# Patient Record
Sex: Male | Born: 1950 | Race: Black or African American | Hispanic: No | Marital: Married | Smoking: Never smoker
Health system: Southern US, Community
[De-identification: ages and names within clinical notes are randomized; demographics above are authoritative.]

## PROBLEM LIST (undated history)

## (undated) ENCOUNTER — Ambulatory Visit (HOSPITAL_COMMUNITY): Admission: EM | Source: Home / Self Care

## (undated) DIAGNOSIS — N200 Calculus of kidney: Secondary | ICD-10-CM

## (undated) DIAGNOSIS — I509 Heart failure, unspecified: Secondary | ICD-10-CM

## (undated) DIAGNOSIS — Z7982 Long term (current) use of aspirin: Secondary | ICD-10-CM

## (undated) DIAGNOSIS — I2510 Atherosclerotic heart disease of native coronary artery without angina pectoris: Secondary | ICD-10-CM

## (undated) DIAGNOSIS — E039 Hypothyroidism, unspecified: Secondary | ICD-10-CM

## (undated) DIAGNOSIS — N400 Enlarged prostate without lower urinary tract symptoms: Secondary | ICD-10-CM

## (undated) DIAGNOSIS — I219 Acute myocardial infarction, unspecified: Secondary | ICD-10-CM

## (undated) DIAGNOSIS — E0500 Thyrotoxicosis with diffuse goiter without thyrotoxic crisis or storm: Secondary | ICD-10-CM

## (undated) DIAGNOSIS — M542 Cervicalgia: Secondary | ICD-10-CM

## (undated) DIAGNOSIS — I700 Atherosclerosis of aorta: Secondary | ICD-10-CM

## (undated) DIAGNOSIS — E785 Hyperlipidemia, unspecified: Secondary | ICD-10-CM

## (undated) DIAGNOSIS — I5020 Unspecified systolic (congestive) heart failure: Secondary | ICD-10-CM

## (undated) DIAGNOSIS — I10 Essential (primary) hypertension: Secondary | ICD-10-CM

## (undated) DIAGNOSIS — R911 Solitary pulmonary nodule: Secondary | ICD-10-CM

## (undated) DIAGNOSIS — Z923 Personal history of irradiation: Secondary | ICD-10-CM

## (undated) DIAGNOSIS — N183 Chronic kidney disease, stage 3 unspecified: Secondary | ICD-10-CM

## (undated) DIAGNOSIS — M4802 Spinal stenosis, cervical region: Secondary | ICD-10-CM

## (undated) DIAGNOSIS — N182 Chronic kidney disease, stage 2 (mild): Secondary | ICD-10-CM

## (undated) DIAGNOSIS — M51379 Other intervertebral disc degeneration, lumbosacral region without mention of lumbar back pain or lower extremity pain: Secondary | ICD-10-CM

## (undated) DIAGNOSIS — I447 Left bundle-branch block, unspecified: Secondary | ICD-10-CM

## (undated) DIAGNOSIS — E890 Postprocedural hypothyroidism: Secondary | ICD-10-CM

## (undated) DIAGNOSIS — Z9889 Other specified postprocedural states: Secondary | ICD-10-CM

## (undated) DIAGNOSIS — K7689 Other specified diseases of liver: Secondary | ICD-10-CM

## (undated) DIAGNOSIS — N529 Male erectile dysfunction, unspecified: Secondary | ICD-10-CM

## (undated) DIAGNOSIS — M8580 Other specified disorders of bone density and structure, unspecified site: Secondary | ICD-10-CM

## (undated) HISTORY — DX: Chronic kidney disease, stage 3 unspecified: N18.30

## (undated) HISTORY — DX: Thyrotoxicosis with diffuse goiter without thyrotoxic crisis or storm: E05.00

## (undated) HISTORY — DX: Other specified disorders of bone density and structure, unspecified site: M85.80

## (undated) HISTORY — DX: Cervicalgia: M54.2

## (undated) HISTORY — DX: Chronic kidney disease, stage 2 (mild): N18.2

## (undated) HISTORY — DX: Hyperlipidemia, unspecified: E78.5

## (undated) HISTORY — PX: HAND TENDON SURGERY: SHX663

## (undated) HISTORY — DX: Other specified postprocedural states: Z98.890

## (undated) HISTORY — PX: COLONOSCOPY: SHX174

## (undated) HISTORY — DX: Personal history of irradiation: Z92.3

## (undated) HISTORY — PX: ACHILLES TENDON REPAIR: SUR1153

## (undated) HISTORY — DX: Benign prostatic hyperplasia without lower urinary tract symptoms: N40.0

## (undated) HISTORY — PX: OTHER SURGICAL HISTORY: SHX169

## (undated) HISTORY — PX: TONSILLECTOMY: SUR1361

## (undated) HISTORY — DX: Essential (primary) hypertension: I10

## (undated) HISTORY — DX: Spinal stenosis, cervical region: M48.02

## (undated) HISTORY — DX: Hypothyroidism, unspecified: E03.9

---

## 1991-05-10 HISTORY — PX: ACHILLES TENDON REPAIR: SUR1153

## 2001-12-13 ENCOUNTER — Ambulatory Visit (HOSPITAL_COMMUNITY): Admission: RE | Admit: 2001-12-13 | Discharge: 2001-12-13 | Payer: Self-pay | Admitting: *Deleted

## 2007-05-03 ENCOUNTER — Emergency Department (HOSPITAL_COMMUNITY): Admission: EM | Admit: 2007-05-03 | Discharge: 2007-05-03 | Payer: Self-pay | Admitting: General Surgery

## 2007-05-09 ENCOUNTER — Encounter: Admission: RE | Admit: 2007-05-09 | Discharge: 2007-05-09 | Payer: Self-pay | Admitting: Family Medicine

## 2009-07-24 ENCOUNTER — Emergency Department (HOSPITAL_COMMUNITY): Admission: EM | Admit: 2009-07-24 | Discharge: 2009-07-25 | Payer: Self-pay | Admitting: Emergency Medicine

## 2010-08-01 LAB — POCT I-STAT, CHEM 8
BUN: 14 mg/dL (ref 6–23)
Calcium, Ion: 0.89 mmol/L — ABNORMAL LOW (ref 1.12–1.32)
Chloride: 105 mEq/L (ref 96–112)
Creatinine, Ser: 1.2 mg/dL (ref 0.4–1.5)
Glucose, Bld: 133 mg/dL — ABNORMAL HIGH (ref 70–99)
Hemoglobin: 13.9 g/dL (ref 13.0–17.0)
TCO2: 29 mmol/L (ref 0–100)

## 2011-02-11 LAB — I-STAT 8, (EC8 V) (CONVERTED LAB)
Bicarbonate: 29.2 — ABNORMAL HIGH
Glucose, Bld: 103 — ABNORMAL HIGH
HCT: 47
Operator id: 272551
Sodium: 136
TCO2: 31
pCO2, Ven: 42.3 — ABNORMAL LOW

## 2011-02-11 LAB — POCT I-STAT CREATININE: Creatinine, Ser: 1.4

## 2012-04-08 ENCOUNTER — Emergency Department (HOSPITAL_COMMUNITY): Admission: EM | Admit: 2012-04-08 | Discharge: 2012-04-08 | Discharge status: Against Medical Advice | Payer: Self-pay

## 2012-04-17 ENCOUNTER — Other Ambulatory Visit: Payer: Self-pay | Admitting: Gastroenterology

## 2012-04-17 DIAGNOSIS — R109 Unspecified abdominal pain: Secondary | ICD-10-CM

## 2012-04-18 ENCOUNTER — Ambulatory Visit
Admission: RE | Admit: 2012-04-18 | Discharge: 2012-04-18 | Disposition: A | Discharge status: Home / Self Care | Payer: 59 | Source: Ambulatory Visit | Attending: Gastroenterology | Admitting: Gastroenterology

## 2012-04-18 DIAGNOSIS — R109 Unspecified abdominal pain: Secondary | ICD-10-CM

## 2012-04-18 MED ORDER — IOHEXOL 300 MG/ML  SOLN
100.0000 mL | Freq: Once | INTRAMUSCULAR | Status: AC | PRN
  Administered 2012-04-18: 100 mL via INTRAVENOUS

## 2012-04-19 ENCOUNTER — Other Ambulatory Visit: Payer: Self-pay | Admitting: Gastroenterology

## 2012-04-19 DIAGNOSIS — R1030 Lower abdominal pain, unspecified: Secondary | ICD-10-CM

## 2012-04-19 DIAGNOSIS — R102 Pelvic and perineal pain: Secondary | ICD-10-CM

## 2012-04-22 ENCOUNTER — Ambulatory Visit
Admission: RE | Admit: 2012-04-22 | Discharge: 2012-04-22 | Disposition: A | Discharge status: Home / Self Care | Payer: 59 | Source: Ambulatory Visit | Attending: Gastroenterology | Admitting: Gastroenterology

## 2012-04-22 DIAGNOSIS — R102 Pelvic and perineal pain: Secondary | ICD-10-CM

## 2012-04-22 DIAGNOSIS — R1030 Lower abdominal pain, unspecified: Secondary | ICD-10-CM

## 2012-04-22 MED ORDER — GADOBENATE DIMEGLUMINE 529 MG/ML IV SOLN
18.0000 mL | Freq: Once | INTRAVENOUS | Status: AC | PRN
  Administered 2012-04-22: 18 mL via INTRAVENOUS

## 2012-04-24 ENCOUNTER — Other Ambulatory Visit: Payer: 59

## 2013-05-08 ENCOUNTER — Ambulatory Visit
Admission: RE | Admit: 2013-05-08 | Discharge: 2013-05-08 | Disposition: A | Discharge status: Home / Self Care | Payer: 59 | Source: Ambulatory Visit | Attending: Family Medicine | Admitting: Family Medicine

## 2013-05-08 ENCOUNTER — Other Ambulatory Visit: Payer: Self-pay | Admitting: Family Medicine

## 2013-05-08 DIAGNOSIS — R202 Paresthesia of skin: Secondary | ICD-10-CM

## 2013-05-08 DIAGNOSIS — M542 Cervicalgia: Secondary | ICD-10-CM

## 2013-09-12 ENCOUNTER — Other Ambulatory Visit: Payer: Self-pay | Admitting: Orthopedic Surgery

## 2013-09-12 ENCOUNTER — Ambulatory Visit
Admission: RE | Admit: 2013-09-12 | Discharge: 2013-09-12 | Disposition: A | Discharge status: Home / Self Care | Payer: 59 | Source: Ambulatory Visit | Attending: Orthopedic Surgery | Admitting: Orthopedic Surgery

## 2013-09-12 DIAGNOSIS — M542 Cervicalgia: Secondary | ICD-10-CM

## 2015-10-14 ENCOUNTER — Other Ambulatory Visit: Payer: Self-pay | Admitting: Family Medicine

## 2015-10-14 ENCOUNTER — Ambulatory Visit
Admission: RE | Admit: 2015-10-14 | Discharge: 2015-10-14 | Disposition: A | Discharge status: Home / Self Care | Payer: Commercial Managed Care - HMO | Source: Ambulatory Visit | Attending: Family Medicine | Admitting: Family Medicine

## 2015-10-14 DIAGNOSIS — M79605 Pain in left leg: Principal | ICD-10-CM

## 2015-10-14 DIAGNOSIS — M79604 Pain in right leg: Secondary | ICD-10-CM

## 2015-10-22 ENCOUNTER — Other Ambulatory Visit: Payer: Self-pay | Admitting: Endocrinology

## 2015-10-22 DIAGNOSIS — E0590 Thyrotoxicosis, unspecified without thyrotoxic crisis or storm: Secondary | ICD-10-CM

## 2015-12-01 ENCOUNTER — Ambulatory Visit (HOSPITAL_COMMUNITY)
Admission: RE | Admit: 2015-12-01 | Discharge: 2015-12-01 | Disposition: A | Discharge status: Home / Self Care | Payer: Commercial Managed Care - HMO | Source: Ambulatory Visit | Attending: Endocrinology | Admitting: Endocrinology

## 2015-12-01 DIAGNOSIS — E0590 Thyrotoxicosis, unspecified without thyrotoxic crisis or storm: Secondary | ICD-10-CM | POA: Insufficient documentation

## 2015-12-01 MED ORDER — SODIUM IODIDE I 131 CAPSULE
9.0000 | Freq: Once | INTRAVENOUS | Status: AC | PRN
  Administered 2015-12-01: 9 via ORAL

## 2015-12-02 ENCOUNTER — Ambulatory Visit (HOSPITAL_COMMUNITY)
Admission: RE | Admit: 2015-12-02 | Discharge: 2015-12-02 | Disposition: A | Discharge status: Home / Self Care | Payer: Commercial Managed Care - HMO | Source: Ambulatory Visit | Attending: Endocrinology | Admitting: Endocrinology

## 2015-12-02 DIAGNOSIS — E0590 Thyrotoxicosis, unspecified without thyrotoxic crisis or storm: Secondary | ICD-10-CM | POA: Diagnosis present

## 2015-12-02 MED ORDER — SODIUM PERTECHNETATE TC 99M INJECTION
9.9000 | Freq: Once | INTRAVENOUS | Status: AC | PRN
  Administered 2015-12-02: 10 via INTRAVENOUS

## 2015-12-06 ENCOUNTER — Other Ambulatory Visit: Payer: Self-pay | Admitting: Endocrinology

## 2015-12-06 DIAGNOSIS — E0590 Thyrotoxicosis, unspecified without thyrotoxic crisis or storm: Secondary | ICD-10-CM

## 2015-12-15 ENCOUNTER — Ambulatory Visit (HOSPITAL_COMMUNITY)
Admission: RE | Admit: 2015-12-15 | Discharge: 2015-12-15 | Disposition: A | Discharge status: Home / Self Care | Payer: Medicare Other | Source: Ambulatory Visit | Attending: Endocrinology | Admitting: Endocrinology

## 2015-12-15 DIAGNOSIS — E0590 Thyrotoxicosis, unspecified without thyrotoxic crisis or storm: Secondary | ICD-10-CM | POA: Diagnosis present

## 2015-12-15 MED ORDER — SODIUM IODIDE I 131 CAPSULE
14.0000 | Freq: Once | INTRAVENOUS | Status: AC | PRN
  Administered 2015-12-15: 14.3 via ORAL

## 2016-12-20 IMAGING — CR DG LUMBAR SPINE COMPLETE 4+V
5 series · 5 of 5 positions shown · non-contrast
Comparison: CT scan 04/18/2012

CLINICAL DATA: Bilateral leg pain, low back pain for 1 month

EXAM:
LUMBAR SPINE - COMPLETE 4+ VIEW

[t l-spine a.p.]
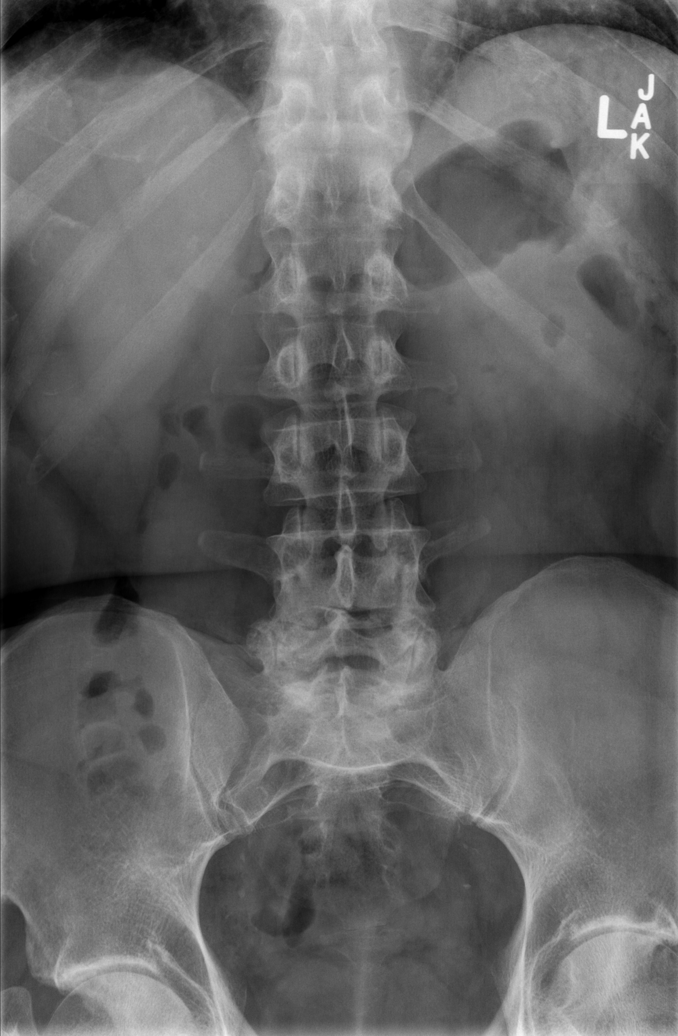

[t l-spine oblique exposure (1 of 2)]
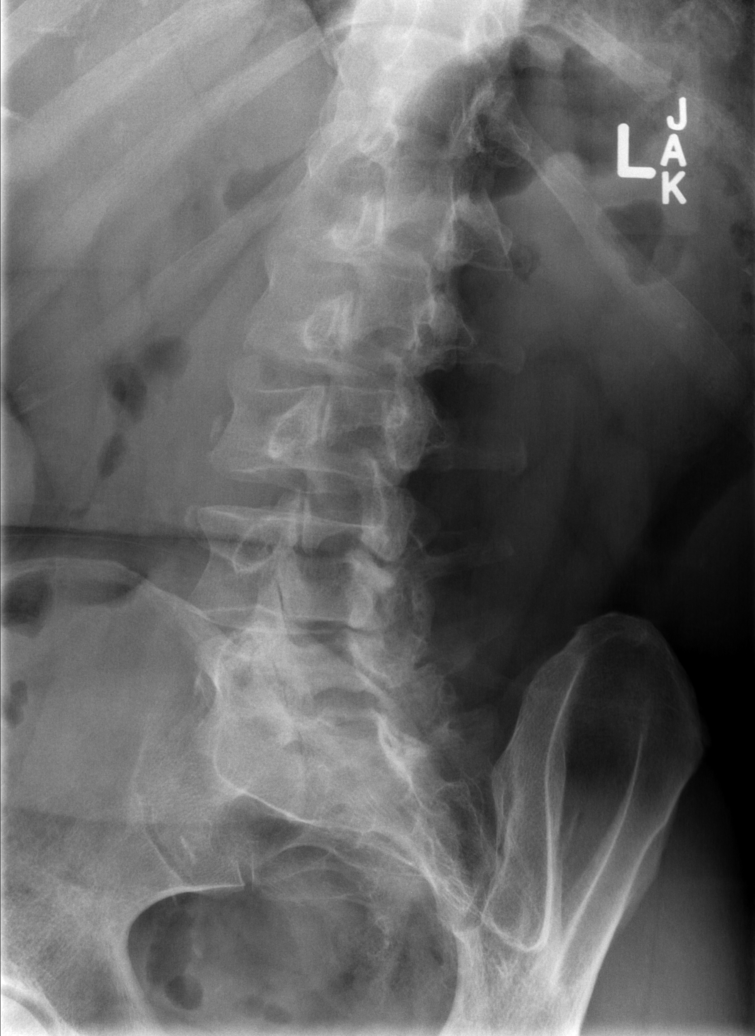

[t l-spine oblique exposure (2 of 2)]
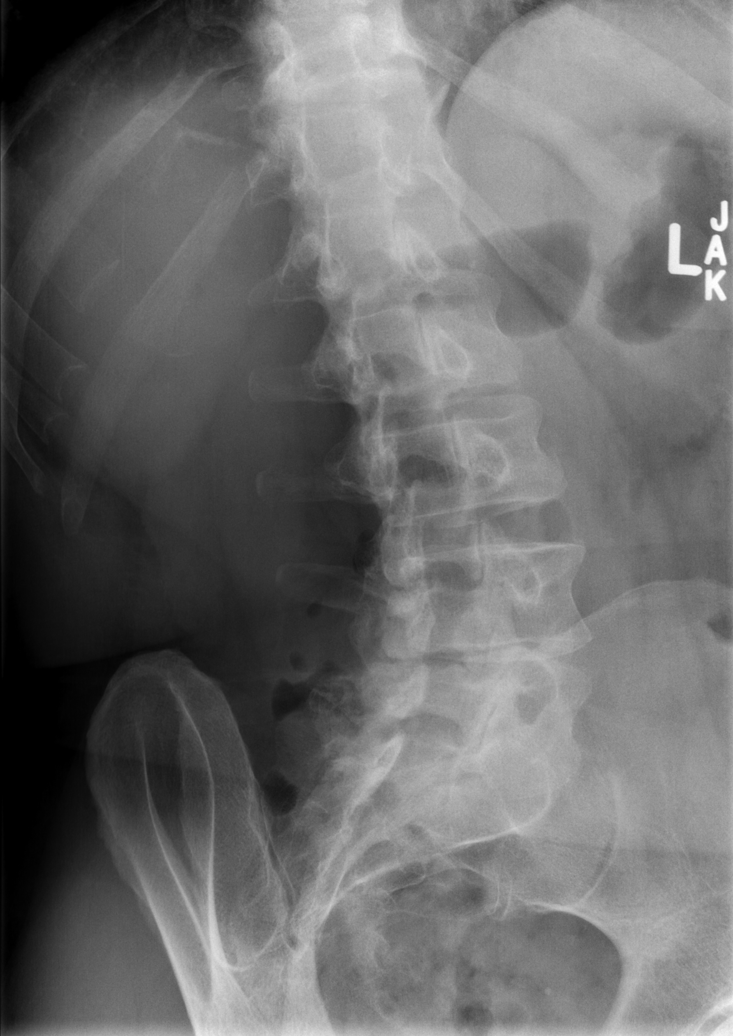

[t l-spine lat]
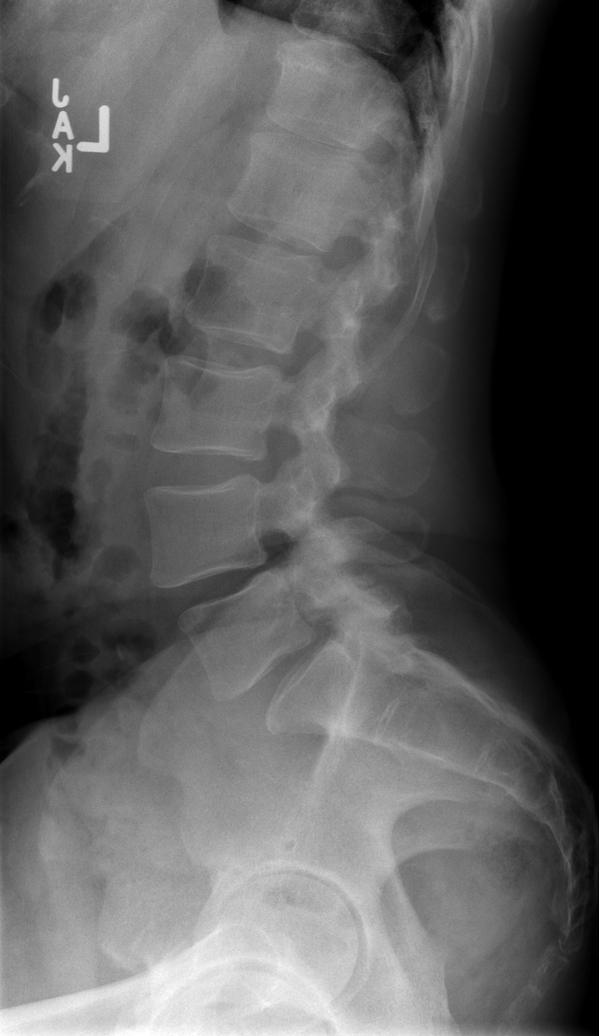

[t l-spine l5-s1 spot]
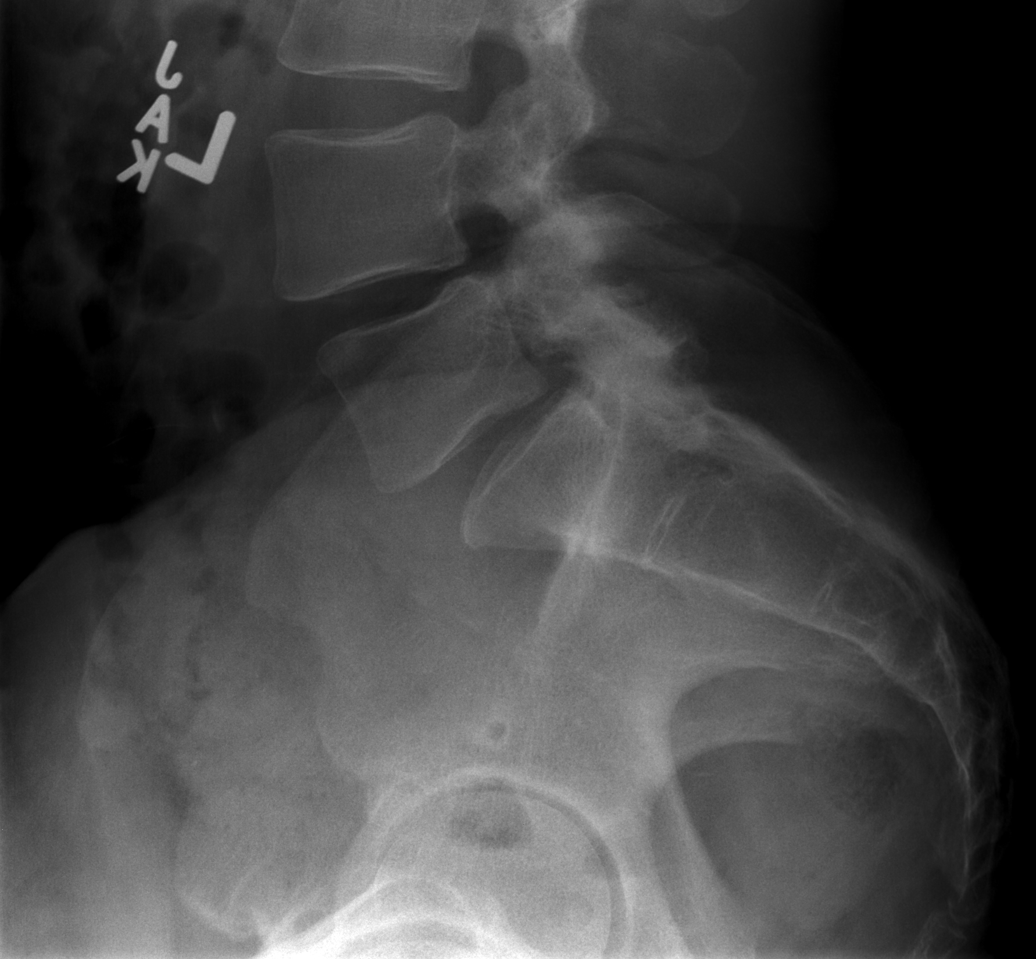

[5 of 5 positions shown; findings below may reference images not displayed]

FINDINGS: Five views of the lumbar spine submitted. No acute fracture. There
is mild anterolisthesis about 3 mm L5 on S1 vertebral body. Facet
degenerative changes noted at L5 level. Mild disc space flattening
at L4-L5 and L5-S1 level. Mild disc space flattening with anterior
spurring noted at T11- T12 level.
IMPRESSION: No acute fracture. Mild disc space flattening at L4-L5 and L5-S1
level. Facet degenerative changes L5 level. There is mild
anterolisthesis about 3 mm L5 on S1 vertebral body.

## 2017-02-20 IMAGING — NM NM RAI THERAPY FOR HYPERTHYROIDISM
1 series · 1 of 1 positions shown · non-contrast
Comparison: Nuclear medicine thyroid scan and uptake 12/02/2015

CLINICAL DATA: Hyperthyroidism

EXAM:
RADIOACTIVE IODINE THERAPY FOR HYPERTHYROIDISM
TECHNIQUE: Radioactive iodine prescribed by Dr. Ceola. The risks and
benefits of radioactive iodine therapy were discussed with the
patient in detail by Dr. Fublack. Radiation safety was discussed
with the patient, including how to protect the general public from
exposure. There were no barriers to communication. Written consent
was obtained. The patient then received a capsule containing the
radiopharmaceutical.
The patient will follow-up with the referring physician.
RADIOPHARMACEUTICALS:  14.3 mCi G-8F8 sodium iodide orally

[Series 1: static · 2.07mm/px · 1 of 1 slices shown]
[im 1/1]
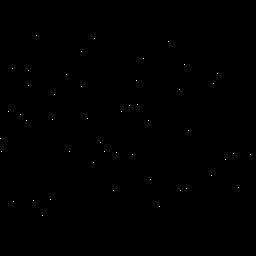

[1 of 1 positions shown; findings below may reference images not displayed]

IMPRESSION: Per oral administration of G-8F8 sodium iodide for the treatment of
hyperthyroidism.

## 2018-10-29 ENCOUNTER — Other Ambulatory Visit: Payer: Self-pay | Admitting: Family Medicine

## 2018-10-29 DIAGNOSIS — N182 Chronic kidney disease, stage 2 (mild): Secondary | ICD-10-CM

## 2018-10-31 ENCOUNTER — Ambulatory Visit
Admission: RE | Admit: 2018-10-31 | Discharge: 2018-10-31 | Disposition: A | Discharge status: Home / Self Care | Payer: Medicare Other | Source: Ambulatory Visit | Attending: Family Medicine | Admitting: Family Medicine

## 2018-10-31 DIAGNOSIS — N182 Chronic kidney disease, stage 2 (mild): Secondary | ICD-10-CM

## 2020-02-11 ENCOUNTER — Ambulatory Visit: Payer: Medicare Other | Attending: Internal Medicine

## 2020-02-11 DIAGNOSIS — Z23 Encounter for immunization: Secondary | ICD-10-CM

## 2020-02-11 NOTE — Progress Notes (Signed)
   Covid-19 Vaccination Clinic  Name:  Omar [place]    MRN: [id number] DOB: 21-Apr-1951  02/11/2020  [name] was observed post Covid-19 immunization for 15 minutes without incident. He was provided with Vaccine Information Sheet and instruction to access the V-Safe system.   [name] was instructed to call 911 with any severe reactions post vaccine: Marland Kitchen Difficulty breathing  . Swelling of face and throat  . A fast heartbeat  . A bad rash all over body  . Dizziness and weakness

## 2020-10-29 ENCOUNTER — Ambulatory Visit: Payer: Medicare Other

## 2020-11-18 ENCOUNTER — Ambulatory Visit: Payer: Medicare Other | Attending: Internal Medicine

## 2020-11-18 ENCOUNTER — Other Ambulatory Visit: Payer: Self-pay

## 2020-11-18 DIAGNOSIS — Z23 Encounter for immunization: Secondary | ICD-10-CM

## 2020-11-19 ENCOUNTER — Other Ambulatory Visit (HOSPITAL_BASED_OUTPATIENT_CLINIC_OR_DEPARTMENT_OTHER): Payer: Self-pay

## 2020-11-19 MED ORDER — PFIZER-BIONT COVID-19 VAC-TRIS 30 MCG/0.3ML IM SUSP
INTRAMUSCULAR | 0 refills | Status: DC
  Filled 2020-11-19: qty 0.3, 1d supply, fill #0

## 2020-11-19 NOTE — Progress Notes (Signed)
   Covid-19 Vaccination Clinic  Name:  Omar Hill    MRN: [id number] DOB: 15-Aug-1950  11/19/2020  [name] was observed post Covid-19 immunization for 15 minutes without incident. He was provided with Vaccine Information Sheet and instruction to access the V-Safe system.   [name] was instructed to call 911 with any severe reactions post vaccine: Difficulty breathing  Swelling of face and throat  A fast heartbeat  A bad rash all over body  Dizziness and weakness   Immunizations Administered     Name Date Dose VIS Date Route   PFIZER Comrnaty(Gray TOP) Covid-19 Vaccine 11/18/2020  2:24 PM 0.3 mL 04/16/2020 Intramuscular   Manufacturer: [employer], [employer]   Lot: [id number]   NDC: [phone]

## 2021-09-21 ENCOUNTER — Other Ambulatory Visit: Payer: Self-pay | Admitting: Internal Medicine

## 2021-09-21 DIAGNOSIS — Z13820 Encounter for screening for osteoporosis: Secondary | ICD-10-CM

## 2021-11-04 ENCOUNTER — Ambulatory Visit
Admission: RE | Admit: 2021-11-04 | Discharge: 2021-11-04 | Disposition: A | Discharge status: Home / Self Care | Payer: Medicare Other | Source: Ambulatory Visit | Attending: Internal Medicine | Admitting: Internal Medicine

## 2021-11-04 DIAGNOSIS — Z13820 Encounter for screening for osteoporosis: Secondary | ICD-10-CM

## 2022-03-07 ENCOUNTER — Encounter (HOSPITAL_BASED_OUTPATIENT_CLINIC_OR_DEPARTMENT_OTHER): Payer: Self-pay

## 2022-03-07 ENCOUNTER — Other Ambulatory Visit: Payer: Self-pay

## 2022-03-07 DIAGNOSIS — R42 Dizziness and giddiness: Secondary | ICD-10-CM | POA: Diagnosis not present

## 2022-03-07 DIAGNOSIS — R112 Nausea with vomiting, unspecified: Secondary | ICD-10-CM | POA: Diagnosis not present

## 2022-03-07 LAB — COMPREHENSIVE METABOLIC PANEL
ALT: 18 U/L (ref 0–44)
AST: 28 U/L (ref 15–41)
Albumin: 4.5 g/dL (ref 3.5–5.0)
Alkaline Phosphatase: 49 U/L (ref 38–126)
Anion gap: 7 (ref 5–15)
BUN: 15 mg/dL (ref 8–23)
CO2: 29 mmol/L (ref 22–32)
Calcium: 9.4 mg/dL (ref 8.9–10.3)
Chloride: 100 mmol/L (ref 98–111)
Creatinine, Ser: 1.25 mg/dL — ABNORMAL HIGH (ref 0.61–1.24)
GFR, Estimated: >60 mL/min (ref >60)
Glucose, Bld: 127 mg/dL — ABNORMAL HIGH (ref 70–99)
Potassium: 3.4 mmol/L — ABNORMAL LOW (ref 3.5–5.1)
Sodium: 136 mmol/L (ref 135–145)
Total Bilirubin: 0.5 mg/dL (ref 0.3–1.2)
Total Protein: 8.3 g/dL — ABNORMAL HIGH (ref 6.5–8.1)

## 2022-03-07 LAB — URINALYSIS, ROUTINE W REFLEX MICROSCOPIC
Bilirubin Urine: NEGATIVE
Glucose, UA: 100 mg/dL — AB
Hgb urine dipstick: NEGATIVE
Ketones, ur: NEGATIVE mg/dL
Leukocytes,Ua: NEGATIVE
Nitrite: NEGATIVE
Protein, ur: NEGATIVE mg/dL
Specific Gravity, Urine: 1.014 (ref 1.005–1.030)
pH: 7.5 (ref 5.0–8.0)

## 2022-03-07 LAB — CBC
HCT: 44.4 % (ref 39.0–52.0)
Hemoglobin: 14.8 g/dL (ref 13.0–17.0)
MCH: 27.7 pg (ref 26.0–34.0)
MCHC: 33.3 g/dL (ref 30.0–36.0)
MCV: 83 fL (ref 80.0–100.0)
Platelets: 204 10*3/uL (ref 150–400)
RBC: 5.35 MIL/uL (ref 4.22–5.81)
RDW: 13.8 % (ref 11.5–15.5)
WBC: 8.6 10*3/uL (ref 4.0–10.5)
nRBC: 0 % (ref 0.0–0.2)

## 2022-03-07 LAB — LIPASE, BLOOD: Lipase: 47 U/L (ref 11–51)

## 2022-03-07 NOTE — ED Triage Notes (Signed)
Pt reports nausea, dizziness, and 1 episode of vomiting after mowing his lawn today. Pt reports relief from the dizziness after the vomiting.

## 2022-03-08 ENCOUNTER — Emergency Department (HOSPITAL_BASED_OUTPATIENT_CLINIC_OR_DEPARTMENT_OTHER)
Admission: EM | Admit: 2022-03-08 | Discharge: 2022-03-08 | Disposition: A | Discharge status: Home / Self Care | Payer: Medicare Other | Attending: Emergency Medicine | Admitting: Emergency Medicine

## 2022-03-08 DIAGNOSIS — R42 Dizziness and giddiness: Secondary | ICD-10-CM

## 2022-03-08 LAB — CBG MONITORING, ED: Glucose-Capillary: 110 mg/dL — ABNORMAL HIGH (ref 70–99)

## 2022-03-08 MED ORDER — MECLIZINE HCL 25 MG PO TABS: 25.0000 mg | ORAL_TABLET | Freq: Three times a day (TID) | ORAL | 0 refills | Status: DC | PRN

## 2022-03-08 NOTE — ED Notes (Signed)
Patient states he vomited x 1 and his nausea, abd pain, and dizziness has " gone away"

## 2022-03-08 NOTE — ED Provider Notes (Signed)
[facility] EMERGENCY DEPT  Provider Note  CSN: [id number] Arrival date & time: 03/07/22 2047  History Chief Complaint  Patient presents with   Dizziness   Nausea    Omar Hill is a 71 y.o. male reports he was in his usual state of health mowing the grass this afternoon. He came in to the house and began to feel dizzy. Describes as room spinning and associated with nausea. Symptoms were worse with standing and improving with lying down. Felt off balance while walking. Symptoms lasted for several hours before he had one episode of emesis and then the dizziness resolved. He has been asymptomatic since then including extended wait time in the ED. No prior history of same.    Home Medications Prior to Admission medications   Medication Sig Start Date End Date Taking? Authorizing Provider  meclizine (ANTIVERT) 25 MG tablet Take 1 tablet (25 mg total) by mouth 3 (three) times daily as needed for dizziness. 03/08/22  Yes Pollyann Savoy, MD  COVID-19 mRNA Vac-TriS, Pfizer, (PFIZER-BIONT COVID-19 VAC-TRIS) SUSP injection Inject into the muscle. 11/19/20   Judyann Munson, MD     Allergies    Patient has no known allergies.   Review of Systems   Review of Systems Please see HPI for pertinent positives and negatives  Physical Exam BP (!) 152/87   Pulse (!) 57   Temp (!) 97.3 F (36.3 C)   Resp 16   Ht 6' (1.829 m)   Wt 89.8 kg   SpO2 97%   BMI 26.85 kg/m   Physical Exam Vitals and nursing note reviewed.  Constitutional:      Appearance: Normal appearance.  HENT:     Head: Normocephalic and atraumatic.     Nose: Nose normal.     Mouth/Throat:     Mouth: Mucous membranes are moist.  Eyes:     Extraocular Movements: Extraocular movements intact.     Conjunctiva/sclera: Conjunctivae normal.  Cardiovascular:     Rate and Rhythm: Normal rate.  Pulmonary:     Effort: Pulmonary effort is normal.     Breath sounds: Normal breath sounds.  Abdominal:      General: Abdomen is flat.     Palpations: Abdomen is soft.     Tenderness: There is no abdominal tenderness.  Musculoskeletal:        General: No swelling. Normal range of motion.     Cervical back: Neck supple.  Skin:    General: Skin is warm and dry.  Neurological:     General: No focal deficit present.     Mental Status: He is alert and oriented to person, place, and time.     Cranial Nerves: No cranial nerve deficit.     Sensory: No sensory deficit.     Motor: No weakness.     Coordination: Coordination normal.     Gait: Gait normal.     Deep Tendon Reflexes: Reflexes normal.  Psychiatric:        Mood and Affect: Mood normal.     ED Results / Procedures / Treatments   EKG EKG Interpretation  Date/Time:  Monday March 07 2022 20:56:00 EDT Ventricular Rate:  57 PR Interval:  224 QRS Duration: 154 QT Interval:  474 QTC Calculation: 461 R Axis:   58 Text Interpretation: Sinus bradycardia with 1st degree A-V block Left bundle branch block Negative sgarbossa's Abnormal ECG When compared with ECG of 03-May-2007 00:30, PR interval has increased Left bundle branch block is  now Present Confirmed by Regan Lemming ([id number]) on 03/07/2022 11:33:40 PM  Procedures Procedures  Medications Ordered in the ED Medications - No data to display  Initial Impression and Plan  Patient here with vertiginous dizziness, lasted for several hours and has now resolved. Exam is benign, low concern for central cause. Suspect BPPV, labs done in triage showed unremarkable CBC, CMP and UA. Will give Rx for Meclizine, PCP follow up, RTED for any worsening.   ED Course       MDM Rules/Calculators/A&P Medical Decision Making Given presenting complaint, I considered that admission might be necessary. After review of results from ED lab and/or imaging studies, admission to the hospital is not indicated at this time.    Problems Addressed: Vertigo: acute illness or injury  Amount and/or Complexity  of Data Reviewed Labs: ordered. Decision-making details documented in ED Course. ECG/medicine tests: ordered and independent interpretation performed. Decision-making details documented in ED Course.  Risk Prescription drug management. Decision regarding hospitalization.    Final Clinical Impression(s) / ED Diagnoses Final diagnoses:  Vertigo    Rx / DC Orders ED Discharge Orders          Ordered    meclizine (ANTIVERT) 25 MG tablet  3 times daily PRN        03/08/22 0141             Truddie Hidden, MD 03/08/22 [phone]

## 2022-05-19 ENCOUNTER — Ambulatory Visit: Payer: Medicare Other | Admitting: Podiatry

## 2022-05-19 VITALS — BP 130/70

## 2022-05-19 DIAGNOSIS — M76821 Posterior tibial tendinitis, right leg: Secondary | ICD-10-CM

## 2022-05-19 NOTE — Progress Notes (Signed)
  Subjective:  Patient ID: Omar Hill, male    DOB: 29-Oct-1950,  MRN: [id number]  Chief Complaint  Patient presents with   Foot Pain    Pt stated that he wanted to have his right foot looked at because he has been having some discomfort     72 y.o. male presents with the above complaint.  Patient presents with right medial foot pain has been going on for quite some time is progressive gotten worse worse with ambulation hurts with pressure.  He would like to discuss treatment options for this.  He states that it has been hurting but is getting much better he did some work up and down the ladder to put the Christmas tree out.  Pain scale is 5 out of 10 now.   Review of Systems: Negative except as noted in the HPI. Denies N/V/F/Ch.  No past medical history on file.  Current Outpatient Medications:    COVID-19 mRNA Vac-TriS, Pfizer, (PFIZER-BIONT COVID-19 VAC-TRIS) SUSP injection, Inject into the muscle., Disp: 0.3 mL, Rfl: 0   meclizine (ANTIVERT) 25 MG tablet, Take 1 tablet (25 mg total) by mouth 3 (three) times daily as needed for dizziness., Disp: 30 tablet, Rfl: 0  Social History   Tobacco Use  Smoking Status Never  Smokeless Tobacco Never    No Known Allergies Objective:   Vitals:   05/19/22 0850  BP: 130/70   There is no height or weight on file to calculate BMI. Constitutional Well developed. Well nourished.  Vascular Dorsalis pedis pulses palpable bilaterally. Posterior tibial pulses palpable bilaterally. Capillary refill normal to all digits.  No cyanosis or clubbing noted. Pedal hair growth normal.  Neurologic Normal speech. Oriented to person, place, and time. Epicritic sensation to light touch grossly present bilaterally.  Dermatologic Nails well groomed and normal in appearance. No open wounds. No skin lesions.  Orthopedic: Pain on palpation to the medial foot.  Pain along the course of the posterior tibial tendon including the insertion pain with  resisted plantarflexion inversion of the foot.  No pain with dorsiflexion eversion of the foot.   Radiographs: None Assessment:   1. Posterior tibial tendinitis, right    Plan:  Patient was evaluated and treated and all questions answered.  Right posterior tibial tendinitis -All questions and concerns were discussed with the patient in extensive detail -Given that his pain is improving and is mild to moderate in nature will benefit from Tri-Lock ankle brace to give stability while ambulating.  If it regresses I discussed that he may benefit from cam boot immobilization. -Tri-Lock ankle brace was dispensed -Continue wearing custom orthotics for pes planovalgus  No follow-ups on file.

## 2022-09-30 ENCOUNTER — Other Ambulatory Visit (HOSPITAL_BASED_OUTPATIENT_CLINIC_OR_DEPARTMENT_OTHER): Payer: Self-pay

## 2022-09-30 MED ORDER — COMIRNATY 30 MCG/0.3ML IM SUSY
0.3000 mL | PREFILLED_SYRINGE | INTRAMUSCULAR | 0 refills | Status: DC
  Filled 2022-09-30: qty 0.3, 1d supply, fill #0

## 2023-04-20 ENCOUNTER — Other Ambulatory Visit (HOSPITAL_BASED_OUTPATIENT_CLINIC_OR_DEPARTMENT_OTHER): Payer: Self-pay | Admitting: Family Medicine

## 2023-04-20 DIAGNOSIS — E782 Mixed hyperlipidemia: Secondary | ICD-10-CM

## 2023-05-10 DIAGNOSIS — C801 Malignant (primary) neoplasm, unspecified: Secondary | ICD-10-CM

## 2023-05-10 HISTORY — DX: Malignant (primary) neoplasm, unspecified: C80.1

## 2023-05-11 ENCOUNTER — Ambulatory Visit (HOSPITAL_BASED_OUTPATIENT_CLINIC_OR_DEPARTMENT_OTHER)
Admission: RE | Admit: 2023-05-11 | Discharge: 2023-05-11 | Disposition: A | Discharge status: Home / Self Care | Payer: Medicare Other | Source: Ambulatory Visit | Attending: Family Medicine | Admitting: Family Medicine

## 2023-05-11 DIAGNOSIS — R911 Solitary pulmonary nodule: Secondary | ICD-10-CM

## 2023-05-11 DIAGNOSIS — E782 Mixed hyperlipidemia: Secondary | ICD-10-CM | POA: Insufficient documentation

## 2023-05-11 HISTORY — DX: Solitary pulmonary nodule: R91.1

## 2023-05-30 ENCOUNTER — Other Ambulatory Visit (HOSPITAL_BASED_OUTPATIENT_CLINIC_OR_DEPARTMENT_OTHER): Payer: Self-pay

## 2023-05-30 MED ORDER — COVID-19 MRNA VAC-TRIS(PFIZER) 30 MCG/0.3ML IM SUSY
0.3000 mL | PREFILLED_SYRINGE | Freq: Once | INTRAMUSCULAR | 0 refills | Status: AC
  Filled 2023-05-30: qty 0.3, 1d supply, fill #0

## 2023-06-15 ENCOUNTER — Encounter: Payer: Self-pay | Admitting: Cardiovascular Disease

## 2023-06-15 ENCOUNTER — Telehealth: Payer: Self-pay

## 2023-06-15 NOTE — Progress Notes (Signed)
 Cardiology Office Note:  .   Date:  06/16/2023  ID:  Omar Hill, DOB [birth year], MRN [id number] PCP: Loreli Kins, MD  [facility] Providers Cardiologist:  None    History of Present Illness: .   Omar [place] is a 73 y.o. male with hx of HTN , HLD,  He had a coronary calcium  score  of 2170 ( 93rd percentile for age / sex matched controls )   No CP , no dyspenea  His Coronary CT scan was ordered by his primary  He was started on Rosuvastatin  when they received the results  He has HTN,   has been well controlled.  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  We discussed the PCSK9 inhibitors and also inclisiran.  He is not not inclined so much to give himself an injection.  We discussed the ease of getting inclisiran injections every 6 months.  Will explore that further.  I will see him in mid June for a follow-up appointment.  He has lipids that will be rechecked by his primary medical doctor later this month.    ROS:   Studies Reviewed: SABRA   EKG Interpretation Date/Time:  Friday June 16 2023 11:35:14 EST Ventricular Rate:  68 PR Interval:  204 QRS Duration:  164 QT Interval:  438 QTC Calculation: 465 R Axis:   88  Text Interpretation: Normal sinus rhythm with sinus arrhythmia Left bundle branch block When compared with ECG of 07-Mar-2022 20:56, No significant change was found Confirmed by Alveta Mungo ([id number]) on 06/16/2023 6:26:09 PM     Risk Assessment/Calculations:             Physical Exam:   VS:  BP 132/80 (BP Location: Right Arm, Patient Position: Sitting)    Pulse 68   Ht 6' (1.829 m)   Wt 194 lb 9.6 oz (88.3 kg)   SpO2 96%   BMI 26.39 kg/m    Wt Readings from Last 3 Encounters:  06/16/23 194 lb 9.6 oz (88.3 kg)  03/07/22 198 lb (89.8 kg)    GEN: Well nourished, well developed in no acute distress NECK: No JVD; No carotid bruits CARDIAC: RRR, no murmurs, rubs, gallops RESPIRATORY:  Clear to auscultation without rales, wheezing or rhonchi  ABDOMEN: Soft, non-tender, non-distended EXTREMITIES:  No edema; No deformity   ASSESSMENT AND PLAN: .     CAC  His las LDL was 154.  He was started on rosuvastatin  at that time.  His goal for LDL is < 70.   He thinks that he might be having some muscle aches that may be related to his rosuvastatin .  He will keep an eye on this.  I would have a very low threshold to refer him for PCSK9 inhibitor or inclisiran if he thinks he is having an intolerance to the rosuvastatin .  He is not having any episodes of angina.  2.   Left bundle branch block :  Has had a LBBB for years ( he recalls having a myoview  test 15 +  years ago  Will get an echocardiogram and a Lexiscan  Myoview  study for further evaluation.    Dispo: 3-4 months   Signed, Aleene Passe, MD

## 2023-06-15 NOTE — Telephone Encounter (Signed)
 Med Rec complete, allergies and Pharmacy verified June 15 2023.

## 2023-06-16 ENCOUNTER — Ambulatory Visit: Payer: PPO | Attending: Cardiovascular Disease | Admitting: Cardiovascular Disease

## 2023-06-16 ENCOUNTER — Encounter: Payer: Self-pay | Admitting: Cardiovascular Disease

## 2023-06-16 VITALS — BP 132/80 | HR 68 | Ht 72.0 in | Wt 194.6 lb

## 2023-06-16 DIAGNOSIS — I10 Essential (primary) hypertension: Secondary | ICD-10-CM

## 2023-06-16 DIAGNOSIS — I2510 Atherosclerotic heart disease of native coronary artery without angina pectoris: Secondary | ICD-10-CM

## 2023-06-16 DIAGNOSIS — E785 Hyperlipidemia, unspecified: Secondary | ICD-10-CM

## 2023-06-16 NOTE — Patient Instructions (Signed)
 Follow-Up: At [facility], you and your health needs are our priority.  As part of our continuing mission to provide you with exceptional heart care, we have created designated Provider Care Teams.  These Care Teams include your primary Cardiologist (physician) and Advanced Practice Providers (APPs -  Physician Assistants and Nurse Practitioners) who all work together to provide you with the care you need, when you need it.  We recommend signing up for the patient portal called MyChart.  Sign up information is provided on this After Visit Summary.  MyChart is used to connect with patients for Virtual Visits (Telemedicine).  Patients are able to view lab/test results, encounter notes, upcoming appointments, etc.  Non-urgent messages can be sent to your provider as well.   To learn more about what you can do with MyChart, go to [email or web address].    Your next appointment:   June 23 @ 9:20 AM  Provider:   Alveta, MD   Other Instructions   1st Floor: - Lobby - Registration  - Pharmacy  - Lab - Cafe  2nd Floor: - PV Lab - Diagnostic Testing (echo, CT, nuclear med)  3rd Floor: - Vacant  4th Floor: - TCTS (cardiothoracic surgery) - AFib Clinic - Structural Heart Clinic - Vascular Surgery  - Vascular Ultrasound  5th Floor: - HeartCare Cardiology (general and EP) - Clinical Pharmacy for coumadin, hypertension, lipid, weight-loss medications, and med management appointments    Valet parking services will be available as well.

## 2023-06-19 ENCOUNTER — Telehealth: Payer: Self-pay

## 2023-06-19 DIAGNOSIS — I447 Left bundle-branch block, unspecified: Secondary | ICD-10-CM

## 2023-06-19 NOTE — Telephone Encounter (Signed)
 Orders placed for ECHO and Lexiscan  at this time. Routed to MD to sign attestation.

## 2023-06-19 NOTE — Telephone Encounter (Signed)
-----   Message from Ahmad Alert sent at 06/16/2023  6:27 PM EST ----- Omar [place] has a LBBB on his ECG  Please order an  echo Lesiscan myoview    I have called him and discussed with him  Thanks  PN

## 2023-06-27 DIAGNOSIS — M25461 Effusion, right knee: Secondary | ICD-10-CM | POA: Diagnosis not present

## 2023-06-27 DIAGNOSIS — M9902 Segmental and somatic dysfunction of thoracic region: Secondary | ICD-10-CM | POA: Diagnosis not present

## 2023-06-27 DIAGNOSIS — M9903 Segmental and somatic dysfunction of lumbar region: Secondary | ICD-10-CM | POA: Diagnosis not present

## 2023-06-27 DIAGNOSIS — M9901 Segmental and somatic dysfunction of cervical region: Secondary | ICD-10-CM | POA: Diagnosis not present

## 2023-06-27 DIAGNOSIS — M25561 Pain in right knee: Secondary | ICD-10-CM | POA: Diagnosis not present

## 2023-06-27 DIAGNOSIS — M62830 Muscle spasm of back: Secondary | ICD-10-CM | POA: Diagnosis not present

## 2023-07-04 DIAGNOSIS — M25461 Effusion, right knee: Secondary | ICD-10-CM | POA: Diagnosis not present

## 2023-07-04 DIAGNOSIS — M9902 Segmental and somatic dysfunction of thoracic region: Secondary | ICD-10-CM | POA: Diagnosis not present

## 2023-07-04 DIAGNOSIS — M9903 Segmental and somatic dysfunction of lumbar region: Secondary | ICD-10-CM | POA: Diagnosis not present

## 2023-07-04 DIAGNOSIS — M62830 Muscle spasm of back: Secondary | ICD-10-CM | POA: Diagnosis not present

## 2023-07-04 DIAGNOSIS — M25561 Pain in right knee: Secondary | ICD-10-CM | POA: Diagnosis not present

## 2023-07-04 DIAGNOSIS — M9901 Segmental and somatic dysfunction of cervical region: Secondary | ICD-10-CM | POA: Diagnosis not present

## 2023-07-05 ENCOUNTER — Encounter (HOSPITAL_COMMUNITY): Payer: Self-pay

## 2023-07-07 ENCOUNTER — Telehealth: Payer: Self-pay

## 2023-07-07 ENCOUNTER — Encounter: Payer: Self-pay | Admitting: Cardiovascular Disease

## 2023-07-07 ENCOUNTER — Ambulatory Visit (HOSPITAL_COMMUNITY): Payer: PPO | Attending: Internal Medicine

## 2023-07-07 ENCOUNTER — Ambulatory Visit (HOSPITAL_BASED_OUTPATIENT_CLINIC_OR_DEPARTMENT_OTHER): Payer: PPO

## 2023-07-07 DIAGNOSIS — I447 Left bundle-branch block, unspecified: Secondary | ICD-10-CM | POA: Diagnosis present

## 2023-07-07 DIAGNOSIS — Z79899 Other long term (current) drug therapy: Secondary | ICD-10-CM

## 2023-07-07 LAB — MYOCARDIAL PERFUSION IMAGING
LV dias vol: 145 mL (ref 62–150)
LV sys vol: 75 mL
Nuc Stress EF: 48 %
Peak HR: 90 {beats}/min
Rest HR: 59 {beats}/min
Rest Nuclear Isotope Dose: 10.5 mCi
SDS: 3
SRS: 4
SSS: 7
ST Depression (mm): 0 mm
Stress Nuclear Isotope Dose: 29.2 mCi
TID: 0.98

## 2023-07-07 LAB — ECHOCARDIOGRAM COMPLETE
Area-P 1/2: 3.71 cm2
S' Lateral: 2.7 cm

## 2023-07-07 MED ORDER — TECHNETIUM TC 99M TETROFOSMIN IV KIT
10.5000 | PACK | Freq: Once | INTRAVENOUS | Status: AC | PRN
  Administered 2023-07-07: 10.5 via INTRAVENOUS

## 2023-07-07 MED ORDER — TECHNETIUM TC 99M TETROFOSMIN IV KIT
29.2000 | PACK | Freq: Once | INTRAVENOUS | Status: AC | PRN
  Administered 2023-07-07: 29.2 via INTRAVENOUS

## 2023-07-07 MED ORDER — REGADENOSON 0.4 MG/5ML IV SOLN
0.4000 mg | Freq: Once | INTRAVENOUS | Status: AC
  Administered 2023-07-07: 0.4 mg via INTRAVENOUS

## 2023-07-07 NOTE — Telephone Encounter (Signed)
 Called and spoke with patient who agrees to plan. Will be out of town, so will get BMET done next Friday.

## 2023-07-07 NOTE — Telephone Encounter (Signed)
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  There is some dyssynchrony associated with the left bundle branch block. Grade 1 diastolic dysfunction Normal RV size and function Normal mitral valve Mild thickening of the aortic valve.  No aortic stenosis or aortic insufficiency  Please have him get a BMP Will anticipate starting him on an ARB ( in place of Entresto) after the BMP is back

## 2023-07-08 DIAGNOSIS — N419 Inflammatory disease of prostate, unspecified: Secondary | ICD-10-CM

## 2023-07-08 HISTORY — DX: Inflammatory disease of prostate, unspecified: N41.9

## 2023-07-10 ENCOUNTER — Other Ambulatory Visit: Payer: Self-pay | Admitting: Family Medicine

## 2023-07-10 ENCOUNTER — Encounter: Payer: Self-pay | Admitting: Cardiovascular Disease

## 2023-07-10 DIAGNOSIS — R911 Solitary pulmonary nodule: Secondary | ICD-10-CM

## 2023-07-14 DIAGNOSIS — Z79899 Other long term (current) drug therapy: Secondary | ICD-10-CM | POA: Diagnosis not present

## 2023-07-14 DIAGNOSIS — I447 Left bundle-branch block, unspecified: Secondary | ICD-10-CM | POA: Diagnosis not present

## 2023-07-15 LAB — BASIC METABOLIC PANEL
BUN/Creatinine Ratio: 9 — ABNORMAL LOW (ref 10–24)
BUN: 13 mg/dL (ref 8–27)
CO2: 24 mmol/L (ref 20–29)
Calcium: 9.7 mg/dL (ref 8.6–10.2)
Chloride: 99 mmol/L (ref 96–106)
Creatinine, Ser: 1.46 mg/dL — ABNORMAL HIGH (ref 0.76–1.27)
Glucose: 81 mg/dL (ref 70–99)
Potassium: 4.2 mmol/L (ref 3.5–5.2)
Sodium: 142 mmol/L (ref 134–144)
eGFR: 51 mL/min/{1.73_m2} — ABNORMAL LOW (ref >59)

## 2023-07-16 ENCOUNTER — Encounter: Payer: Self-pay | Admitting: Cardiovascular Disease

## 2023-07-24 ENCOUNTER — Encounter: Payer: Self-pay | Admitting: Cardiovascular Disease

## 2023-07-24 NOTE — H&P (View-Only) (Signed)
 Cardiology Office Note:  .   Date:  07/25/2023  ID:  Omar Hill, DOB 11-09-1950, MRN [id number] PCP: Omar Raider, MD  [facility] Providers Cardiologist:  None    History of Present Illness: .   Omar Hill is a 73 y.o. male with hx of HTN , HLD,  He had a coronary calcium score  of 2170 ( 93rd percentile for age / sex matched controls )   No CP , no dyspenea  His Coronary CT scan was ordered by his primary  He was started on Rosuvastatin when they received the results  He has HTN,   has been well controlled.  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  We discussed the PCSK9 inhibitors and also inclisiran.  He is not not inclined so much to give himself an injection.  We discussed the ease of getting inclisiran injections every 6 months.  Will explore that further.  I will see him in mid June for a follow-up appointment.  He has lipids that will be rechecked by his primary medical doctor later this month.   July 25, 2023 Omar [place] is seen for follow up of his LBBB, HTL Seem with wife, [place] in Feb. 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  We had a discussion about heart catheterization.    We discussed the risk, benefits, options of heart catheterization.  He understands and agrees to  proceed.   He has some degree of chronic kidney disease.  His last creatinine is 1.46.  My plan is to eventually have him stop the amlodipine and chlorthalidone and start him on ARB or Entresto, spironolactone, possibly Lasix.  I do not want to start any of these medications before the cath because I do not want his creatinine to be affected at this time.  Will anticipate having a return to see an APP in 4 to 6 weeks and at that time we can transition him from amlodipine to an ARB or entresto   He will avoid salt and processed foods.   ROS:   Studies Reviewed: .         Risk Assessment/Calculations:       Physical Exam:    Physical Exam: Blood pressure 139/70, pulse 78, resp. rate 16, weight 193 lb 12.8 oz (87.9 kg), SpO2 98%.       GEN:  Well nourished, well developed in no acute distress HEENT: Normal NECK: No JVD; No carotid bruits LYMPHATICS: No lymphadenopathy CARDIAC: RRR , no murmurs, rubs, gallops RESPIRATORY:  Clear to auscultation without rales, wheezing or rhonchi  ABDOMEN: Soft, non-tender, non-distended MUSCULOSKELETAL:  No edema; No deformity  SKIN: Warm and dry NEUROLOGIC:  Alert and oriented x 3  ECG   EKG Interpretation Date/Time:  Tuesday July 25 2023 12:43:40 EDT Ventricular Rate:  62 PR Interval:  216 QRS Duration:  174 QT Interval:  470 QTC Calculation: 477 R Axis:   -74  Text Interpretation: Sinus rhythm with 1st degree A-V block Left axis deviation Left bundle branch block When compared with ECG of 16-Jun-2023 11:35, QRS axis Shifted left T wave inversion no longer evident in Inferior leads Confirmed by Omar Hill ([id number]) on 07/25/2023 5:00:02 PM     ASSESSMENT AND PLAN: .     CAC  His last LDL was 154.  He was started on rosuvastatin at that time.    Myoview study was performed for further evaluation of his left bundle branch block.  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  There is also might be due to his left bundle branch block.  We will schedule him for heart catheterization.  We have discussed the risk, benefits, options of heart catheterization.  He understands and agrees to proceed.  He has a creatinine of 1.4.  He will need to arrive early for IV hydration prior to the heart cath.   2.   Left bundle branch block :  Has had a LBBB for years ( he recalls having a myoview test 15 + years ago  LV systolic function is mildly reduced with an EF of 40 to 45%.  Following his heart catheterization I anticipate discontinuing amlodipine and starting him on an ARB or perhaps Entresto.  He will need to also transition from chlorthalidone to spironolactone.  He may also need a loop diuretic.  We discussed the fact that we would follow his LVEF closely.  If his EF ever drops below 35% while on medical therapy, will refer him to the electrophysiologist for consideration of CRT pacer.  3.  Hyperlipidemia: Continue rosuvastatin.  His last LDL is 39.  Signed, Omar Miss, MD

## 2023-07-24 NOTE — Progress Notes (Unsigned)
 Cardiology Office Note:  .   Date:  07/25/2023  ID:  Santiago Bumpers Treese, DOB Jan 11, 1951, MRN [id number] PCP: Lupita Raider, MD  [facility] Providers Cardiologist:  None    History of Present Illness: .   Omar Hill is a 73 y.o. male with hx of HTN , HLD,  He had a coronary calcium score  of 2170 ( 93rd percentile for age / sex matched controls )   No CP , no dyspenea  His Coronary CT scan was ordered by his primary  He was started on Rosuvastatin when they received the results  He has HTN,   has been well controlled.  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  We discussed the PCSK9 inhibitors and also inclisiran.  He is not not inclined so much to give himself an injection.  We discussed the ease of getting inclisiran injections every 6 months.  Will explore that further.  I will see him in mid June for a follow-up appointment.  He has lipids that will be rechecked by his primary medical doctor later this month.   July 25, 2023 Dimetri is seen for follow up of his LBBB, HTL Seem with wife, [name] in Feb. 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  We had a discussion about heart catheterization.    We discussed the risk, benefits, options of heart catheterization.  He understands and agrees to  proceed.   He has some degree of chronic kidney disease.  His last creatinine is 1.46.  My plan is to eventually have him stop the amlodipine and chlorthalidone and start him on ARB or Entresto, spironolactone, possibly Lasix.  I do not want to start any of these medications before the cath because I do not want his creatinine to be affected at this time.  Will anticipate having a return to see an APP in 4 to 6 weeks and at that time we can transition him from amlodipine to an ARB or entresto   He will avoid salt and processed foods.   ROS:   Studies Reviewed: .         Risk Assessment/Calculations:       Physical Exam:    Physical Exam: Blood pressure 139/70, pulse 78, resp. rate 16, weight 193 lb 12.8 oz (87.9 kg), SpO2 98%.       GEN:  Well nourished, well developed in no acute distress HEENT: Normal NECK: No JVD; No carotid bruits LYMPHATICS: No lymphadenopathy CARDIAC: RRR , no murmurs, rubs, gallops RESPIRATORY:  Clear to auscultation without rales, wheezing or rhonchi  ABDOMEN: Soft, non-tender, non-distended MUSCULOSKELETAL:  No edema; No deformity  SKIN: Warm and dry NEUROLOGIC:  Alert and oriented x 3  ECG   EKG Interpretation Date/Time:  Tuesday July 25 2023 12:43:40 EDT Ventricular Rate:  62 PR Interval:  216 QRS Duration:  174 QT Interval:  470 QTC Calculation: 477 R Axis:   -74  Text Interpretation: Sinus rhythm with 1st degree A-V block Left axis deviation Left bundle branch block When compared with ECG of 16-Jun-2023 11:35, QRS axis Shifted left T wave inversion no longer evident in Inferior leads Confirmed by Kristeen Miss ([id number]) on 07/25/2023 5:00:02 PM     ASSESSMENT AND PLAN: .     CAC  His last LDL was 154.  He was started on rosuvastatin at that time.    Myoview study was performed for further evaluation of his left bundle branch block.  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  There is also might be due to his left bundle branch block.  We will schedule him for heart catheterization.  We have discussed the risk, benefits, options of heart catheterization.  He understands and agrees to proceed.  He has a creatinine of 1.4.  He will need to arrive early for IV hydration prior to the heart cath.   2.   Left bundle branch block :  Has had a LBBB for years ( he recalls having a myoview test 15 + years ago  LV systolic function is mildly reduced with an EF of 40 to 45%.  Following his heart catheterization I anticipate discontinuing amlodipine and starting him on an ARB or perhaps Entresto.  He will need to also transition from chlorthalidone to spironolactone.  He may also need a loop diuretic.  We discussed the fact that we would follow his LVEF closely.  If his EF ever drops below 35% while on medical therapy, will refer him to the electrophysiologist for consideration of CRT pacer.  3.  Hyperlipidemia: Continue rosuvastatin.  His last LDL is 39.  Signed, Kristeen Miss, MD

## 2023-07-25 ENCOUNTER — Encounter: Payer: Self-pay | Admitting: Cardiovascular Disease

## 2023-07-25 ENCOUNTER — Ambulatory Visit: Attending: Cardiovascular Disease | Admitting: Cardiovascular Disease

## 2023-07-25 VITALS — BP 139/70 | HR 78 | Resp 16 | Wt 193.8 lb

## 2023-07-25 DIAGNOSIS — M9901 Segmental and somatic dysfunction of cervical region: Secondary | ICD-10-CM | POA: Diagnosis not present

## 2023-07-25 DIAGNOSIS — M25461 Effusion, right knee: Secondary | ICD-10-CM | POA: Diagnosis not present

## 2023-07-25 DIAGNOSIS — M9903 Segmental and somatic dysfunction of lumbar region: Secondary | ICD-10-CM | POA: Diagnosis not present

## 2023-07-25 DIAGNOSIS — R9439 Abnormal result of other cardiovascular function study: Secondary | ICD-10-CM

## 2023-07-25 DIAGNOSIS — I2510 Atherosclerotic heart disease of native coronary artery without angina pectoris: Secondary | ICD-10-CM | POA: Diagnosis not present

## 2023-07-25 DIAGNOSIS — M25561 Pain in right knee: Secondary | ICD-10-CM | POA: Diagnosis not present

## 2023-07-25 DIAGNOSIS — M62830 Muscle spasm of back: Secondary | ICD-10-CM | POA: Diagnosis not present

## 2023-07-25 DIAGNOSIS — I447 Left bundle-branch block, unspecified: Secondary | ICD-10-CM | POA: Diagnosis not present

## 2023-07-25 DIAGNOSIS — M9902 Segmental and somatic dysfunction of thoracic region: Secondary | ICD-10-CM | POA: Diagnosis not present

## 2023-07-25 DIAGNOSIS — Z01812 Encounter for preprocedural laboratory examination: Secondary | ICD-10-CM | POA: Diagnosis not present

## 2023-07-25 MED ORDER — SODIUM CHLORIDE 0.9% FLUSH: 3.0000 mL | Freq: Two times a day (BID) | INTRAVENOUS | Status: DC

## 2023-07-25 NOTE — Patient Instructions (Signed)
 Medication Instructions:  Your physician recommends that you continue on your current medications as directed. Please refer to the Current Medication list given to you today.  *If you need a refill on your cardiac medications before your next appointment, please call your pharmacy*  Lab Work: TODAY: CBC, BMET If you have labs (blood work) drawn today and your tests are completely normal, you will receive your results only by: MyChart Message (if you have MyChart) OR A paper copy in the mail If you have any lab test that is abnormal or we need to change your treatment, we will call you to review the results.  Testing/Procedures: Your physician has requested that you have a cardiac catheterization. Cardiac catheterization is used to diagnose and/or treat various heart conditions. Doctors may recommend this procedure for a number of different reasons. The most common reason is to evaluate chest pain. Chest pain can be a symptom of coronary artery disease (CAD), and cardiac catheterization can show whether plaque is narrowing or blocking your heart's arteries. This procedure is also used to evaluate the valves, as well as measure the blood flow and oxygen levels in different parts of your heart. For further information please visit [email or web address] Please follow instruction sheet, as given.  Follow-Up: At [facility], you and your health needs are our priority.  As part of our continuing mission to provide you with exceptional heart care, we have created designated Provider Care Teams.  These Care Teams include your primary Cardiologist (physician) and Advanced Practice Providers (APPs -  Physician Assistants and Nurse Practitioners) who all work together to provide you with the care you need, when you need it.  Your next appointment:   4-6 week(s)  The format for your next appointment:   In Person  Provider:   Jari Favre, PA-C, Ronie Spies, PA-C, Robin Searing, NP, Jacolyn Reedy, PA-C,  Tereso Newcomer, PA-C, or Perlie Gold, PA-C   Other Instructions     Cardiac/Peripheral Catheterization   You are scheduled for a Cardiac Catheterization on Monday, March 24 with Dr.  Yates Decamp .  1. Please arrive at the [facility] (Main Entrance A) at [facility]: [address] [place], [place] [address] at 6:30 AM (This time is 4.5 hour(s) before your procedure to ensure your preparation).   Free valet parking service is available. You will check in at ADMITTING. The support person will be asked to wait in the waiting room.  It is OK to have someone drop you off and come back when you are ready to be discharged.        Special note: Every effort is made to have your procedure done on time. Please understand that emergencies sometimes delay scheduled procedures.  2. Diet: Do not eat solid foods after midnight.  You may have clear liquids until 5 AM the day of the procedure.  3. Labs: Today: CBC, BMET  4. Medication instructions in preparation for your procedure:   Contrast Allergy: No  On the morning of your procedure, take Aspirin 81 mg and any morning medicines NOT listed above.  You may use sips of water.  5. Plan to go home the same day, you will only stay overnight if medically necessary. 6. You MUST have a responsible adult to drive you home. 7. An adult MUST be with you the first 24 hours after you arrive home. 8. Bring a current list of your medications, and the last time and date medication taken. 9. Bring ID and current  insurance cards. 10.Please wear clothes that are easy to get on and off and wear slip-on shoes.  Thank you for allowing [place] to care for you!   -- [facility]    1st Floor: - Lobby - Registration  - Pharmacy  - Lab - Cafe  2nd Floor: - PV Lab - Diagnostic Testing (echo, CT, nuclear med)  3rd Floor: - Vacant  4th Floor: - TCTS (cardiothoracic surgery) - AFib Clinic - Structural Heart Clinic -  Vascular Surgery  - Vascular Ultrasound  5th Floor: - HeartCare Cardiology (general and EP) - Clinical Pharmacy for coumadin, hypertension, lipid, weight-loss medications, and med management appointments    Valet parking services will be available as well.

## 2023-07-26 LAB — CBC
Hematocrit: 45.2 % (ref 37.5–51.0)
Hemoglobin: 14.6 g/dL (ref 13.0–17.7)
MCH: 27.8 pg (ref 26.6–33.0)
MCHC: 32.3 g/dL (ref 31.5–35.7)
MCV: 86 fL (ref 79–97)
Platelets: 226 10*3/uL (ref 150–450)
RBC: 5.25 x10E6/uL (ref 4.14–5.80)
RDW: 13.6 % (ref 11.6–15.4)
WBC: 7.2 10*3/uL (ref 3.4–10.8)

## 2023-07-26 LAB — BASIC METABOLIC PANEL
BUN/Creatinine Ratio: 10 (ref 10–24)
BUN: 12 mg/dL (ref 8–27)
CO2: 27 mmol/L (ref 20–29)
Calcium: 9.5 mg/dL (ref 8.6–10.2)
Chloride: 101 mmol/L (ref 96–106)
Creatinine, Ser: 1.19 mg/dL (ref 0.76–1.27)
Glucose: 92 mg/dL (ref 70–99)
Potassium: 3.7 mmol/L (ref 3.5–5.2)
Sodium: 140 mmol/L (ref 134–144)
eGFR: 65 mL/min/{1.73_m2} (ref >59)

## 2023-07-26 NOTE — Progress Notes (Signed)
 Dr Elease Hashimoto, 07/25/23 Creatinine 1.19, eGFR 65. Creatinine improved/normal and eGFR > 45, do you still want him to go in early for IV hydration pre-cath? Thanks,  Thurston Hole

## 2023-07-27 ENCOUNTER — Telehealth: Payer: Self-pay | Admitting: *Deleted

## 2023-07-27 ENCOUNTER — Encounter: Payer: Self-pay | Admitting: Cardiovascular Disease

## 2023-07-27 NOTE — Telephone Encounter (Addendum)
 Cardiac Catheterization scheduled at [facility] for: Monday July 31, 2023 11 AM Arrival time [facility] Main Entrance A at: 9 AM (Arrival time change -07/25/23 eGFR 65 Creatinine 1.19-pre-procedure hydration not necessary)  Nothing to eat after midnight prior to procedure, clear liquids until 5 AM day of procedure.  Medication instructions: -Hold:  Chlorthalidone-AM of procedure  -Other usual morning medications can be taken with sips of water including aspirin 81 mg.  Plan to go home the same day, you will only stay overnight if medically necessary.  You must have responsible adult to drive you home.  Someone must be with you the first 24 hours after you arrive home.  Reviewed procedure instructions with patient.

## 2023-07-27 NOTE — Telephone Encounter (Deleted)
 Cardiac Catheterization scheduled at [facility] for: Arrival time [facility] Main Entrance A at:  Nothing to eat after midnight prior to procedure, clear liquids until 5 AM day of procedure.  Medication instructions: -Hold:  Chlothalidone-AM of procedure  -Other usual morning medications can be taken with sips of water including aspirin 81 mg.  Plan to go home the same day, you will only stay overnight if medically necessary.  You must have responsible adult to drive you home.  Someone must be with you the first 24 hours after you arrive home.

## 2023-07-31 ENCOUNTER — Other Ambulatory Visit: Payer: Self-pay

## 2023-07-31 ENCOUNTER — Emergency Department (HOSPITAL_COMMUNITY)
Admission: EM | Admit: 2023-07-31 | Discharge: 2023-07-31 | Disposition: A | Discharge status: Home / Self Care | Source: Home / Self Care | Attending: Student | Admitting: Student

## 2023-07-31 ENCOUNTER — Encounter (HOSPITAL_COMMUNITY)
Admission: RE | Disposition: A | Discharge status: Home / Self Care | Payer: Self-pay | Source: Home / Self Care | Attending: Cardiology

## 2023-07-31 ENCOUNTER — Ambulatory Visit (HOSPITAL_COMMUNITY)
Admission: RE | Admit: 2023-07-31 | Discharge: 2023-07-31 | Disposition: A | Discharge status: Home / Self Care | Attending: Cardiology | Admitting: Cardiology

## 2023-07-31 ENCOUNTER — Encounter (HOSPITAL_COMMUNITY): Payer: Self-pay | Admitting: Cardiology

## 2023-07-31 ENCOUNTER — Encounter: Payer: Self-pay | Admitting: Cardiovascular Disease

## 2023-07-31 DIAGNOSIS — R339 Retention of urine, unspecified: Secondary | ICD-10-CM | POA: Insufficient documentation

## 2023-07-31 DIAGNOSIS — I129 Hypertensive chronic kidney disease with stage 1 through stage 4 chronic kidney disease, or unspecified chronic kidney disease: Secondary | ICD-10-CM | POA: Insufficient documentation

## 2023-07-31 DIAGNOSIS — N189 Chronic kidney disease, unspecified: Secondary | ICD-10-CM | POA: Insufficient documentation

## 2023-07-31 DIAGNOSIS — Z79899 Other long term (current) drug therapy: Secondary | ICD-10-CM | POA: Insufficient documentation

## 2023-07-31 DIAGNOSIS — Z7982 Long term (current) use of aspirin: Secondary | ICD-10-CM | POA: Insufficient documentation

## 2023-07-31 DIAGNOSIS — I447 Left bundle-branch block, unspecified: Secondary | ICD-10-CM | POA: Insufficient documentation

## 2023-07-31 DIAGNOSIS — R9439 Abnormal result of other cardiovascular function study: Secondary | ICD-10-CM

## 2023-07-31 DIAGNOSIS — E7800 Pure hypercholesterolemia, unspecified: Secondary | ICD-10-CM | POA: Diagnosis not present

## 2023-07-31 DIAGNOSIS — I2510 Atherosclerotic heart disease of native coronary artery without angina pectoris: Secondary | ICD-10-CM | POA: Diagnosis not present

## 2023-07-31 HISTORY — PX: LEFT HEART CATH AND CORONARY ANGIOGRAPHY: CATH118249

## 2023-07-31 SURGERY — LEFT HEART CATH AND CORONARY ANGIOGRAPHY
Anesthesia: LOCAL

## 2023-07-31 MED ORDER — SODIUM CHLORIDE 0.9% FLUSH: 3.0000 mL | INTRAVENOUS | Status: DC | PRN

## 2023-07-31 MED ORDER — IOHEXOL 350 MG/ML SOLN
INTRAVENOUS | Status: DC | PRN
  Administered 2023-07-31: 20 mL

## 2023-07-31 MED ORDER — SODIUM CHLORIDE 0.9 % WEIGHT BASED INFUSION
3.0000 mL/kg/h | INTRAVENOUS | Status: AC
Start: 2023-07-31 — End: 2023-07-31

## 2023-07-31 MED ORDER — FENTANYL CITRATE (PF) 100 MCG/2ML IJ SOLN
INTRAMUSCULAR | Status: AC
  Filled 2023-07-31: qty 2

## 2023-07-31 MED ORDER — MIDAZOLAM HCL 2 MG/2ML IJ SOLN
INTRAMUSCULAR | Status: DC | PRN
  Administered 2023-07-31: 2 mg via INTRAVENOUS

## 2023-07-31 MED ORDER — LIDOCAINE HCL URETHRAL/MUCOSAL 2 % EX GEL
1.0000 | Freq: Once | CUTANEOUS | Status: AC
  Administered 2023-07-31: 1 via URETHRAL
  Filled 2023-07-31: qty 6

## 2023-07-31 MED ORDER — LIDOCAINE HCL (PF) 1 % IJ SOLN
INTRAMUSCULAR | Status: DC | PRN
  Administered 2023-07-31: 2 mL

## 2023-07-31 MED ORDER — ASPIRIN 81 MG PO CHEW
81.0000 mg | CHEWABLE_TABLET | ORAL | Status: AC
  Administered 2023-07-31: 81 mg via ORAL
  Filled 2023-07-31: qty 1

## 2023-07-31 MED ORDER — HEPARIN (PORCINE) IN NACL 1000-0.9 UT/500ML-% IV SOLN
INTRAVENOUS | Status: DC | PRN
  Administered 2023-07-31 (×2): 500 mL

## 2023-07-31 MED ORDER — HEPARIN (PORCINE) IN NACL 2-0.9 UNITS/ML
INTRAMUSCULAR | Status: DC | PRN
  Administered 2023-07-31: 10 mL via INTRA_ARTERIAL

## 2023-07-31 MED ORDER — ASPIRIN 81 MG PO CHEW: 81.0000 mg | CHEWABLE_TABLET | ORAL | Status: DC

## 2023-07-31 MED ORDER — SODIUM CHLORIDE 0.9% FLUSH
3.0000 mL | INTRAVENOUS | Status: DC | PRN
Start: 2023-07-31 — End: 2023-07-31

## 2023-07-31 MED ORDER — FENTANYL CITRATE (PF) 100 MCG/2ML IJ SOLN
INTRAMUSCULAR | Status: DC | PRN
  Administered 2023-07-31: 25 ug via INTRAVENOUS

## 2023-07-31 MED ORDER — LIDOCAINE HCL (PF) 1 % IJ SOLN
INTRAMUSCULAR | Status: AC
  Filled 2023-07-31: qty 30

## 2023-07-31 MED ORDER — SODIUM CHLORIDE 0.9 % WEIGHT BASED INFUSION: 1.0000 mL/kg/h | INTRAVENOUS | Status: DC

## 2023-07-31 MED ORDER — HEPARIN SODIUM (PORCINE) 1000 UNIT/ML IJ SOLN
INTRAMUSCULAR | Status: DC | PRN
  Administered 2023-07-31: 4000 [IU] via INTRAVENOUS

## 2023-07-31 MED ORDER — SODIUM CHLORIDE 0.9 % IV SOLN: 250.0000 mL | INTRAVENOUS | Status: DC | PRN

## 2023-07-31 MED ORDER — VERAPAMIL HCL 2.5 MG/ML IV SOLN
INTRAVENOUS | Status: AC
  Filled 2023-07-31: qty 2

## 2023-07-31 MED ORDER — ACETAMINOPHEN 325 MG PO TABS: 650.0000 mg | ORAL_TABLET | ORAL | Status: DC | PRN

## 2023-07-31 MED ORDER — MIDAZOLAM HCL 2 MG/2ML IJ SOLN
INTRAMUSCULAR | Status: AC
  Filled 2023-07-31: qty 2

## 2023-07-31 MED ORDER — HEPARIN SODIUM (PORCINE) 1000 UNIT/ML IJ SOLN
INTRAMUSCULAR | Status: AC
  Filled 2023-07-31: qty 10

## 2023-07-31 MED ORDER — TAMSULOSIN HCL 0.4 MG PO CAPS
0.4000 mg | ORAL_CAPSULE | Freq: Once | ORAL | Status: AC
  Administered 2023-07-31: 0.4 mg via ORAL
  Filled 2023-07-31 (×2): qty 1

## 2023-07-31 MED ORDER — ONDANSETRON HCL 4 MG/2ML IJ SOLN: 4.0000 mg | Freq: Four times a day (QID) | INTRAMUSCULAR | Status: DC | PRN

## 2023-07-31 SURGICAL SUPPLY — 8 items
CATH INFINITI AMBI 5FR TG (CATHETERS) IMPLANT
DEVICE RAD COMP TR BAND LRG (VASCULAR PRODUCTS) IMPLANT
ELECT DEFIB PAD ADLT CADENCE (PAD) IMPLANT
GLIDESHEATH SLEND A-KIT 6F 22G (SHEATH) IMPLANT
GUIDEWIRE INQWIRE 1.5J.035X260 (WIRE) IMPLANT
INQWIRE 1.5J .035X260CM (WIRE) ×1 IMPLANT
PACK CARDIAC CATHETERIZATION (CUSTOM PROCEDURE TRAY) ×1 IMPLANT
SET ATX-X65L (MISCELLANEOUS) IMPLANT

## 2023-07-31 NOTE — Discharge Instructions (Signed)
 You had difficulty peeing after your procedure, so we put a catheter in you.  You will need to follow-up with urology, for a void trial, to try to get your catheter out.  Please return to the ER if your Foley is not draining.  Follow skin care/VAC care, as instructed.

## 2023-07-31 NOTE — Progress Notes (Signed)
 Coude catheter used for I&O. Placed by Martie Lee, RN and Shann Medal, RN. Return of clear, yellow urine.

## 2023-07-31 NOTE — Interval H&P Note (Signed)
 History and Physical Interval Note:  07/31/2023 10:34 AM  Omar Hill  has presented today for surgery, with the diagnosis of left bundle branch block.  The various methods of treatment have been discussed with the patient and family. After consideration of risks, benefits and other options for treatment, the patient has consented to  Procedure(s): LEFT HEART CATH AND CORONARY ANGIOGRAPHY (N/A) and possible coronary angioplasty as a surgical intervention.  The patient's history has been reviewed, patient examined, no change in status, stable for surgery.  I have reviewed the patient's chart and labs.  Questions were answered to the patient's satisfaction.   Cath Lab Visit (complete for each Cath Lab visit)  Clinical Evaluation Leading to the Procedure:   ACS: No.  Non-ACS:    Anginal Classification: No Symptoms  Anti-ischemic medical therapy: No Therapy  Non-Invasive Test Results: Intermediate-risk stress test findings: cardiac mortality 1-3%/year  Prior CABG: No previous CABG   Yates Decamp

## 2023-07-31 NOTE — ED Provider Notes (Signed)
 [place] EMERGENCY DEPARTMENT AT [facility] Provider Note   CSN: [id number] Arrival date & time: 07/31/23  1820     History  Chief Complaint  Patient presents with   Urinary Retention    Omar Hill is a 73 y.o. male, CKD, hypertension, who presents to the ED secondary to difficulty urinating, have after having a cardiac cath today.  He states he had a cardiac cath today, and he is now having difficulty urinating.  Had difficulty urinating before going to the hospital, but they In-N-Out cath him, and he was able to go.  Since then he has been 6+ hours since he is urinated, and feels bladder pressure.  Denies any other pain.     Home Medications Prior to Admission medications   Medication Sig Start Date End Date Taking? Authorizing Provider  amLODipine (NORVASC) 10 MG tablet Take 10 mg by mouth daily.    [provider]  aspirin EC 81 MG tablet Take 81 mg by mouth daily. Swallow whole.    [provider]  Calcium Carbonate (CALCIUM 500 PO) Take 1 tablet by mouth daily.    [provider]  chlorthalidone (HYGROTON) 25 MG tablet Take 12.5 mg by mouth daily.    [provider]  Cholecalciferol (VITAMIN D) 50 MCG (2000 UT) tablet Take 2,000 Units by mouth daily.    [provider]  clindamycin (CLEOCIN) 150 MG capsule Take 150 mg by mouth 3 (three) times daily. 07/26/23   [provider]  levothyroxine (SYNTHROID) 150 MCG tablet Take 150 mcg by mouth daily before breakfast.    [provider]  Multiple Vitamin (MULTIVITAMIN) capsule Take 1 capsule by mouth daily.    [provider]  multivitamin-lutein (OCUVITE-LUTEIN) CAPS capsule Take 1 capsule by mouth daily.    [provider]  Omega-3 Fatty Acids (FISH OIL) 1000 MG CAPS Take 1,000 mg by mouth daily.    [provider]  rosuvastatin (CRESTOR) 40 MG tablet Take 40 mg by mouth daily.    [provider]  tadalafil (CIALIS)  5 MG tablet Take 5 mg by mouth daily as needed for erectile dysfunction.    [provider]  tamsulosin (FLOMAX) 0.4 MG CAPS capsule Take 0.4 mg by mouth daily.    [provider]      Allergies    Patient has no known allergies.    Review of Systems   Review of Systems  Constitutional:  Negative for fever.  Genitourinary:  Positive for decreased urine volume and difficulty urinating.    Physical Exam Updated Vital Signs BP 130/77 (BP Location: Right Arm)   Pulse 87   Temp 98.7 F (37.1 C)   Resp 16   Ht 5\' 11"  (1.803 m)   Wt 87.1 kg   SpO2 100%   BMI 26.78 kg/m  Physical Exam Vitals and nursing note reviewed.  Constitutional:      General: He is not in acute distress.    Appearance: He is well-developed.  HENT:     Head: Normocephalic and atraumatic.  Eyes:     Conjunctiva/sclera: Conjunctivae normal.  Cardiovascular:     Rate and Rhythm: Normal rate and regular rhythm.     Heart sounds: No murmur heard. Pulmonary:     Effort: Pulmonary effort is normal. No respiratory distress.     Breath sounds: Normal breath sounds.  Abdominal:     Palpations: Abdomen is soft.     Tenderness: There is no abdominal  tenderness.     Comments: Distended bladder  Musculoskeletal:        General: No swelling.     Cervical back: Neck supple.  Skin:    General: Skin is warm and dry.     Capillary Refill: Capillary refill takes less than 2 seconds.  Neurological:     Mental Status: He is alert.  Psychiatric:        Mood and Affect: Mood normal.     ED Results / Procedures / Treatments   Labs (all labs ordered are listed, but only abnormal results are displayed) Labs Reviewed - No data to display  EKG None  Radiology CARDIAC CATHETERIZATION Result Date: 07/31/2023 Images from the original result were not included. Left Heart Catheterization 07/31/23: Hemodynamic data: LVEDP 11 mmHg, no pressure gradient across the aortic valve. Angiographic data: There is  severe diffuse coronary calcification involving the proximal RCA, LM, LAD and CX much more severe in LAD. LM: Large-caliber vessel, it is disease-free. LCx: Large-caliber vessel, gives origin to a very large OM 2 with a ostial 80% stenosis and OM 3, disease-free. LAD: Large-caliber vessel, gives origin to a Tenille Morrill sized D1 which has secondary branch, D1 is diffusely diseased.  There is a prox LAD 30% stenosis followed by origin of a moderate to large size D2 with minimal disease and moderate-sized D3 that is diffusely diseased.  Apical LAD has minimal disease. Impression and recommendations: Patient has angiographically significant mid LAD stenosis and also OM 2 branch of CX stenosis.  However patient is completely asymptomatic without chest pain or symptoms of heart failure, states that he walks at least 30 minutes on a daily basis in a brisk fashion without any symptoms.  In view of this, although has calcific high-grade stenosis in the mid LAD, would recommend medical therapy in the absence of symptoms.  As proximal LAD is not involved, would recommend continued medical therapy.    Procedures Procedures    Medications Ordered in ED Medications - No data to display  ED Course/ Medical Decision Making/ A&P                                 Medical Decision Making Patient is a 73 year old male, here for difficulty urinating, after being discharged from a cardiac cath.  His bladder scan shows 500 mL.  He had a Foley catheter anchored, and had relief of his symptoms.  He has no other complaints.  Discharged home with strict return precautions and follow-up with urology   Final Clinical Impression(s) / ED Diagnoses Final diagnoses:  Urinary retention    Rx / DC Orders ED Discharge Orders     None         Pete Pelt, PA 07/31/23 1904    Glendora Score, MD 08/03/23 1349

## 2023-07-31 NOTE — ED Triage Notes (Signed)
 Pt states he had a heart cath today and has not been able to urinate since.  He had an in and out cath done before leaving the hospital at 2pm and has not had to urinate since. He has the urge and severe pain but has not been able to urinate.   Bladder scan in triage revealed 478. Pt is very uncomfortable and unable to sit still.

## 2023-08-01 ENCOUNTER — Telehealth: Payer: Self-pay

## 2023-08-01 DIAGNOSIS — Z79899 Other long term (current) drug therapy: Secondary | ICD-10-CM

## 2023-08-01 DIAGNOSIS — R339 Retention of urine, unspecified: Secondary | ICD-10-CM

## 2023-08-01 NOTE — Telephone Encounter (Signed)
 Called and spoke with patient and wife. He will come to [facility] at our office in 2 days (Thursday) to get BMET drawn. Order placed and released at this time.

## 2023-08-01 NOTE — Telephone Encounter (Signed)
-----   Message from Kristeen Miss sent at 07/31/2023 12:47 PM EDT ----- He likely had an anterior apical MI in the past . Please check BMP on Thursday  If his renal function is stable, will began transitioning to GDMT for his CHF / LBBB ( DC chlorthal and start Spironolactone Reduce / DC amlodipine and start ARB / Sherryll Burger  Consider adding Jardiance or [place] if affordable

## 2023-08-03 DIAGNOSIS — Z79899 Other long term (current) drug therapy: Secondary | ICD-10-CM | POA: Diagnosis not present

## 2023-08-03 DIAGNOSIS — R339 Retention of urine, unspecified: Secondary | ICD-10-CM | POA: Diagnosis not present

## 2023-08-03 LAB — BASIC METABOLIC PANEL WITH GFR
BUN/Creatinine Ratio: 9 — ABNORMAL LOW (ref 10–24)
BUN: 12 mg/dL (ref 8–27)
CO2: 26 mmol/L (ref 20–29)
Calcium: 9.7 mg/dL (ref 8.6–10.2)
Chloride: 100 mmol/L (ref 96–106)
Creatinine, Ser: 1.29 mg/dL — ABNORMAL HIGH (ref 0.76–1.27)
Glucose: 89 mg/dL (ref 70–99)
Potassium: 3.9 mmol/L (ref 3.5–5.2)
Sodium: 140 mmol/L (ref 134–144)
eGFR: 59 mL/min/{1.73_m2} — ABNORMAL LOW (ref >59)

## 2023-08-04 ENCOUNTER — Encounter: Payer: Self-pay | Admitting: Cardiovascular Disease

## 2023-08-07 DIAGNOSIS — R338 Other retention of urine: Secondary | ICD-10-CM | POA: Diagnosis not present

## 2023-08-07 DIAGNOSIS — R972 Elevated prostate specific antigen [PSA]: Secondary | ICD-10-CM | POA: Diagnosis not present

## 2023-08-08 DIAGNOSIS — M62830 Muscle spasm of back: Secondary | ICD-10-CM | POA: Diagnosis not present

## 2023-08-08 DIAGNOSIS — M9903 Segmental and somatic dysfunction of lumbar region: Secondary | ICD-10-CM | POA: Diagnosis not present

## 2023-08-08 DIAGNOSIS — M9901 Segmental and somatic dysfunction of cervical region: Secondary | ICD-10-CM | POA: Diagnosis not present

## 2023-08-08 DIAGNOSIS — M25461 Effusion, right knee: Secondary | ICD-10-CM | POA: Diagnosis not present

## 2023-08-08 DIAGNOSIS — M9902 Segmental and somatic dysfunction of thoracic region: Secondary | ICD-10-CM | POA: Diagnosis not present

## 2023-08-08 DIAGNOSIS — M25561 Pain in right knee: Secondary | ICD-10-CM | POA: Diagnosis not present

## 2023-08-09 ENCOUNTER — Ambulatory Visit
Admission: RE | Admit: 2023-08-09 | Discharge: 2023-08-09 | Disposition: A | Discharge status: Home / Self Care | Source: Ambulatory Visit | Attending: Family Medicine | Admitting: Family Medicine

## 2023-08-09 DIAGNOSIS — J479 Bronchiectasis, uncomplicated: Secondary | ICD-10-CM | POA: Diagnosis not present

## 2023-08-09 DIAGNOSIS — I700 Atherosclerosis of aorta: Secondary | ICD-10-CM | POA: Diagnosis not present

## 2023-08-09 DIAGNOSIS — R300 Dysuria: Secondary | ICD-10-CM | POA: Diagnosis not present

## 2023-08-09 DIAGNOSIS — R911 Solitary pulmonary nodule: Secondary | ICD-10-CM

## 2023-08-09 DIAGNOSIS — N411 Chronic prostatitis: Secondary | ICD-10-CM | POA: Diagnosis not present

## 2023-08-09 DIAGNOSIS — R338 Other retention of urine: Secondary | ICD-10-CM | POA: Diagnosis not present

## 2023-08-09 DIAGNOSIS — R8271 Bacteriuria: Secondary | ICD-10-CM | POA: Diagnosis not present

## 2023-08-09 DIAGNOSIS — R972 Elevated prostate specific antigen [PSA]: Secondary | ICD-10-CM | POA: Diagnosis not present

## 2023-08-09 DIAGNOSIS — N401 Enlarged prostate with lower urinary tract symptoms: Secondary | ICD-10-CM | POA: Diagnosis not present

## 2023-08-09 DIAGNOSIS — I2510 Atherosclerotic heart disease of native coronary artery without angina pectoris: Secondary | ICD-10-CM | POA: Diagnosis not present

## 2023-08-21 DIAGNOSIS — H2513 Age-related nuclear cataract, bilateral: Secondary | ICD-10-CM | POA: Diagnosis not present

## 2023-08-21 DIAGNOSIS — H25013 Cortical age-related cataract, bilateral: Secondary | ICD-10-CM | POA: Diagnosis not present

## 2023-08-22 ENCOUNTER — Telehealth: Payer: Self-pay

## 2023-08-22 DIAGNOSIS — M62830 Muscle spasm of back: Secondary | ICD-10-CM | POA: Diagnosis not present

## 2023-08-22 DIAGNOSIS — M9901 Segmental and somatic dysfunction of cervical region: Secondary | ICD-10-CM | POA: Diagnosis not present

## 2023-08-22 DIAGNOSIS — M9903 Segmental and somatic dysfunction of lumbar region: Secondary | ICD-10-CM | POA: Diagnosis not present

## 2023-08-22 DIAGNOSIS — Z860100 Personal history of colon polyps, unspecified: Secondary | ICD-10-CM | POA: Diagnosis not present

## 2023-08-22 DIAGNOSIS — K5900 Constipation, unspecified: Secondary | ICD-10-CM | POA: Diagnosis not present

## 2023-08-22 DIAGNOSIS — M25561 Pain in right knee: Secondary | ICD-10-CM | POA: Diagnosis not present

## 2023-08-22 DIAGNOSIS — M25461 Effusion, right knee: Secondary | ICD-10-CM | POA: Diagnosis not present

## 2023-08-22 DIAGNOSIS — Z1211 Encounter for screening for malignant neoplasm of colon: Secondary | ICD-10-CM | POA: Diagnosis not present

## 2023-08-22 DIAGNOSIS — M9902 Segmental and somatic dysfunction of thoracic region: Secondary | ICD-10-CM | POA: Diagnosis not present

## 2023-08-22 NOTE — Telephone Encounter (Signed)
   Name: Omar Hill  DOB: 1950/12/06  MRN: [id number]  Primary Cardiologist: None  Chart reviewed as part of pre-operative protocol coverage. The patient has an upcoming visit scheduled with Dr. Alroy Aspen on 08/31/2023 at which time clearance can be addressed in case there are any issues that would impact surgical recommendations.  Colonoscopy is not scheduled until 09/20/2023 as below. I added preop FYI to appointment note so that provider is aware to address at time of outpatient visit.  Per office protocol the cardiology provider should forward their finalized clearance decision and recommendations regarding antiplatelet therapy to the requesting party below.    Pharmacy request for aspirin hold is not indicated per clearance request form.  I will route this message as FYI to requesting party and remove this message from the preop box as separate preop APP input not needed at this time.   Please call with any questions.  Ava Boatman, NP  08/22/2023, 3:41 PM

## 2023-08-22 NOTE — Telephone Encounter (Signed)
   Pre-operative Risk Assessment    Patient Name: Omar Hill  DOB: [birth year] MRN: [id number]   Date of last office visit: 07/25/23 Dr. Alroy Aspen  Date of next office visit: 08/31/23 with Dr. Alroy Aspen    Request for Surgical Clearance    Procedure:   Colonoscopy   Date of Surgery:  Clearance 09/20/23                                 Surgeon:  Dr. Hilma Lucks Group or Practice Name: [facility] [place]   Phone number:  [phone] Fax number:  [phone]   Type of Clearance Requested:   - Medical  - Pharmacy:  Hold Aspirin not indicated    Type of Anesthesia:   Propofol    Additional requests/questions:    Omar Hill   08/22/2023, 3:25 PM

## 2023-08-30 ENCOUNTER — Encounter: Payer: Self-pay | Admitting: Cardiovascular Disease

## 2023-08-30 ENCOUNTER — Ambulatory Visit: Admitting: Student in an Organized Health Care Education/Training Program

## 2023-08-30 ENCOUNTER — Encounter: Payer: Self-pay | Admitting: Student in an Organized Health Care Education/Training Program

## 2023-08-30 ENCOUNTER — Telehealth: Payer: Self-pay

## 2023-08-30 VITALS — BP 120/80 | HR 75 | Temp 98.8°F | Ht 72.0 in | Wt 187.8 lb

## 2023-08-30 DIAGNOSIS — R972 Elevated prostate specific antigen [PSA]: Secondary | ICD-10-CM | POA: Diagnosis not present

## 2023-08-30 DIAGNOSIS — N401 Enlarged prostate with lower urinary tract symptoms: Secondary | ICD-10-CM | POA: Diagnosis not present

## 2023-08-30 DIAGNOSIS — R300 Dysuria: Secondary | ICD-10-CM | POA: Diagnosis not present

## 2023-08-30 DIAGNOSIS — R911 Solitary pulmonary nodule: Secondary | ICD-10-CM | POA: Diagnosis not present

## 2023-08-30 DIAGNOSIS — N529 Male erectile dysfunction, unspecified: Secondary | ICD-10-CM | POA: Diagnosis not present

## 2023-08-30 DIAGNOSIS — N411 Chronic prostatitis: Secondary | ICD-10-CM | POA: Diagnosis not present

## 2023-08-30 NOTE — H&P (View-Only) (Signed)
 Assessment & Plan:   1. Lung nodule (Primary)  Nodule Location: RLL Nodule Size: Solid component of 17 mm Nodule Spiculation: Yes Associated Lymphadenopathy: No Smoking Status (never) Extrathoracic cancer > 5 years prior (no) SPN malignancy risk score [facility]): 31 %risk of malignancy ECOG: 0  The patient is here to discuss their imaging abnormalities which include a mixed solid and subsolid nodule with associated spiculation and a satellite nodule in the right lower lobe. This presentation is highly concerning for malignancy for which further workup is necessary. Discussed all of this with the patient and agreed that the best pathway moving forward is to obtain a PET/CT to evaluate for FDG avidity as well as any sign of distant spread. We will also obtain a pulmonary function test to evaluate baseline lung function should he require resection.  We will arrange for robotic assisted navigational bronchoscopy for biopsy to establish the diagnosis.  We discussed the importance of diagnosis and staging in lung malignancies, and the approach to obtaining a tissue diagnosis which would include robotic assisted navigational bronchoscopy with endobronchial ultrasound guided sampling.  We also discussed the risks associated with the procedure which include a 2% risk of pneumothorax, infection, bleeding, and nondiagnostic procedure in detail.  I explained that patients typically are able to return home the same day of the procedure, but in rare cases admission to the hospital for observation and treatment is required.  After our discussion, the patient elected to proceed with the procedure  Recommendations:  - Pulmonary Function Test; Future - Procedural/ Surgical Case Request: ROBOTIC ASSISTED NAVIGATIONAL BRONCHOSCOPY; Future - CT SUPER D CHEST WO CONTRAST; Future - NM PET Image Initial (PI) Skull Base To Thigh (F-18 FDG); Future  I spent 60 minutes caring for this patient today,  including preparing to see the patient, obtaining a medical history , reviewing a separately obtained history, performing a medically appropriate examination and/or evaluation, counseling and educating the patient/family/caregiver, ordering medications, tests, or procedures, documenting clinical information in the electronic health record, and independently interpreting results (not separately reported/billed) and communicating results to the patient/family/caregiver  Vergia Glasgow, MD [place] [facility]  End of visit medications:  No orders of the defined types were placed in this encounter.    Current Outpatient Medications:    amLODipine (NORVASC) 10 MG tablet, Take 10 mg by mouth daily., Disp: , Rfl:    aspirin  EC 81 MG tablet, Take 81 mg by mouth daily. Swallow whole., Disp: , Rfl:    Calcium Carbonate (CALCIUM 500 PO), Take 1 tablet by mouth daily., Disp: , Rfl:    chlorthalidone (HYGROTON) 25 MG tablet, Take 12.5 mg by mouth daily., Disp: , Rfl:    Cholecalciferol (VITAMIN D) 50 MCG (2000 UT) tablet, Take 2,000 Units by mouth daily., Disp: , Rfl:    ciprofloxacin (CIPRO) 500 MG tablet, Take 500 mg by mouth 2 (two) times daily., Disp: , Rfl:    levothyroxine (SYNTHROID) 150 MCG tablet, Take 150 mcg by mouth daily before breakfast., Disp: , Rfl:    Multiple Vitamin (MULTIVITAMIN) capsule, Take 1 capsule by mouth daily., Disp: , Rfl:    multivitamin-lutein (OCUVITE-LUTEIN) CAPS capsule, Take 1 capsule by mouth daily., Disp: , Rfl:    Omega-3 Fatty Acids (FISH OIL) 1000 MG CAPS, Take 1,000 mg by mouth daily., Disp: , Rfl:    rosuvastatin (CRESTOR) 40 MG tablet, Take 40 mg by mouth daily., Disp: , Rfl:    tadalafil (CIALIS) 5 MG tablet, Take 5  mg by mouth daily as needed for erectile dysfunction., Disp: , Rfl:    tamsulosin  (FLOMAX ) 0.4 MG CAPS capsule, Take 0.4 mg by mouth daily., Disp: , Rfl:    clindamycin (CLEOCIN) 150 MG capsule, Take 150 mg by mouth 3 (three) times  daily. (Patient not taking: Reported on 08/30/2023), Disp: , Rfl:    Subjective:   PATIENT ID: Omar Hill GENDER: male DOB: 1950-05-25, MRN: [id number]  Chief Complaint  Patient presents with   New Patient (Initial Visit)    Right lower lobe nodule found on cardiac calcium CT, CT: 05/20/23 & 08/28/23  No breathing problems or symptoms, no cough, SOB, DOE. Snoring, restlessness, no gasping or witnessed apneas.    HPI  Patient is a pleasant 73 year old male presenting to clinic for the evaluation of a right lower lobe pulmonary nodule.  Patient is in his usual state of health and without any symptoms.  He denies any cough, shortness of breath, chest pain, chest tightness, fevers, chills, night sweats, or weight loss.  He underwent a coronary CTA in January 2025 that showed a right lower lobe pulmonary nodule with a mixed solid and subsolid component. This was further evaluated with a dedicated chest CT in April 2025.  The CT showed the spiculated part solid nodule in the right lower lobe with an adjacent satellite nodule concerning for bronchogenic carcinoma. Patient was referred to pulmonary for the evaluation of said nodule.  Patient has been followed closely by his cardiologist and underwent LHC given coronary CT suggesting high risk for CAD. LHC 07/2023 showed severe diffuse coronary calcifications for which medical management was recommended.  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 He denies any occupational exposures and denies any inhalational exposures.  Denies any smoking or vape use.  Ancillary information including prior medications, full medical/surgical/family/social histories, and PFTs (when available) are listed below and have been reviewed.   Review of Systems  Constitutional:  Negative for chills, fever and weight loss.  Respiratory:  Negative for cough, hemoptysis, sputum production and shortness of breath.   Cardiovascular:   Negative for chest pain and palpitations.     Objective:   Vitals:   08/30/23 1305  BP: 120/80  Pulse: 75  Temp: 98.8 F (37.1 C)  TempSrc: Oral  SpO2: 98%  Weight: 187 lb 12.8 oz (85.2 kg)  Height: 6' (1.829 m)   98% on RA  BMI Readings from Last 3 Encounters:  08/30/23 25.47 kg/m  07/31/23 26.78 kg/m  07/31/23 26.78 kg/m   Wt Readings from Last 3 Encounters:  08/30/23 187 lb 12.8 oz (85.2 kg)  07/31/23 192 lb (87.1 kg)  07/31/23 192 lb (87.1 kg)    Physical Exam Constitutional:      Appearance: Normal appearance.  HENT:     Head: Normocephalic and atraumatic.  Cardiovascular:     Rate and Rhythm: Normal rate and regular rhythm.     Pulses: Normal pulses.     Heart sounds: Normal heart sounds.  Pulmonary:     Effort: Pulmonary effort is normal.     Breath sounds: Normal breath sounds.  Musculoskeletal:     Cervical back: Normal range of motion and neck supple.  Neurological:     General: No focal deficit present.     Mental Status: He is alert and oriented to person, place, and time. Mental status is at baseline.     Ancillary Information    Past Medical History:  Diagnosis Date  BPH (benign prostatic hyperplasia)    Cervical spinal stenosis    Cervicalgia    CKD (chronic kidney disease) stage 2, GFR 60-89 ml/min    CKD (chronic kidney disease), stage III (HCC)    Graves disease    H/O prostate biopsy    History of radioactive iodine thyroid  ablation    HTN (hypertension)    Hyperlipidemia    Hypothyroidism    Osteopenia      Family History  Problem Relation Age of Onset   Lupus Mother    Cancer - Colon Father    Cancer - Prostate Father    Heart failure Sister    Sarcoidosis Sister      Past Surgical History:  Procedure Laterality Date   ACHILLES TENDON REPAIR Left    LEFT HEART CATH AND CORONARY ANGIOGRAPHY N/A 07/31/2023   Procedure: LEFT HEART CATH AND CORONARY ANGIOGRAPHY;  Surgeon: Knox Perl, MD;  Location: [facility] INVASIVE CV  LAB;  Service: Cardiovascular;  Laterality: N/A;   TONSILS      Social History   Socioeconomic History   Marital status: Married    Spouse name: Not on file   Number of children: Not on file   Years of education: Not on file   Highest education level: Not on file  Occupational History   Not on file  Tobacco Use   Smoking status: Never   Smokeless tobacco: Never  Vaping Use   Vaping status: Never Used  Substance and Sexual Activity   Alcohol use: Yes    Comment: RARELY   Drug use: Never   Sexual activity: Never  Other Topics Concern   Not on file  Social History Narrative   Not on file   Social Drivers of Health   Financial Resource Strain: Not on file  Food Insecurity: Not on file  Transportation Needs: Not on file  Physical Activity: Not on file  Stress: Not on file  Social Connections: Not on file  Intimate Partner Violence: Not on file     Allergies  Allergen Reactions   Pollen Extract Cough     CBC    Component Value Date/Time   WBC 7.2 07/25/2023 1327   WBC 8.6 03/07/2022 2059   RBC 5.25 07/25/2023 1327   RBC 5.35 03/07/2022 2059   HGB 14.6 07/25/2023 1327   HCT 45.2 07/25/2023 1327   PLT 226 07/25/2023 1327   MCV 86 07/25/2023 1327   MCH 27.8 07/25/2023 1327   MCH 27.7 03/07/2022 2059   MCHC 32.3 07/25/2023 1327   MCHC 33.3 03/07/2022 2059   RDW 13.6 07/25/2023 1327    Pulmonary Functions Testing Results:     No data to display          Outpatient Medications Prior to Visit  Medication Sig Dispense Refill   amLODipine (NORVASC) 10 MG tablet Take 10 mg by mouth daily.     aspirin  EC 81 MG tablet Take 81 mg by mouth daily. Swallow whole.     Calcium Carbonate (CALCIUM 500 PO) Take 1 tablet by mouth daily.     chlorthalidone (HYGROTON) 25 MG tablet Take 12.5 mg by mouth daily.     Cholecalciferol (VITAMIN D) 50 MCG (2000 UT) tablet Take 2,000 Units by mouth daily.     ciprofloxacin (CIPRO) 500 MG tablet Take 500 mg by mouth 2 (two)  times daily.     levothyroxine (SYNTHROID) 150 MCG tablet Take 150 mcg by mouth daily before breakfast.     Multiple  Vitamin (MULTIVITAMIN) capsule Take 1 capsule by mouth daily.     multivitamin-lutein (OCUVITE-LUTEIN) CAPS capsule Take 1 capsule by mouth daily.     Omega-3 Fatty Acids (FISH OIL) 1000 MG CAPS Take 1,000 mg by mouth daily.     rosuvastatin (CRESTOR) 40 MG tablet Take 40 mg by mouth daily.     tadalafil (CIALIS) 5 MG tablet Take 5 mg by mouth daily as needed for erectile dysfunction.     tamsulosin  (FLOMAX ) 0.4 MG CAPS capsule Take 0.4 mg by mouth daily.     clindamycin (CLEOCIN) 150 MG capsule Take 150 mg by mouth 3 (three) times daily. (Patient not taking: Reported on 08/30/2023)     No facility-administered medications prior to visit.

## 2023-08-30 NOTE — Telephone Encounter (Signed)
Patient aware of date and time.  

## 2023-08-30 NOTE — Telephone Encounter (Signed)
 Robotic Bronchoscopy 09/12/2023 11:30am Lung Nodule [id number], [id number]  Omar Hill please see Bronch info.

## 2023-08-30 NOTE — Progress Notes (Signed)
 Assessment & Plan:   1. Lung nodule (Primary)  Nodule Location: RLL Nodule Size: Solid component of 17 mm Nodule Spiculation: Yes Associated Lymphadenopathy: No Smoking Status (never) Extrathoracic cancer > 5 years prior (no) SPN malignancy risk score [facility]): 31 %risk of malignancy ECOG: 0  The patient is here to discuss their imaging abnormalities which include a mixed solid and subsolid nodule with associated spiculation and a satellite nodule in the right lower lobe. This presentation is highly concerning for malignancy for which further workup is necessary. Discussed all of this with the patient and agreed that the best pathway moving forward is to obtain a PET/CT to evaluate for FDG avidity as well as any sign of distant spread. We will also obtain a pulmonary function test to evaluate baseline lung function should he require resection.  We will arrange for robotic assisted navigational bronchoscopy for biopsy to establish the diagnosis.  We discussed the importance of diagnosis and staging in lung malignancies, and the approach to obtaining a tissue diagnosis which would include robotic assisted navigational bronchoscopy with endobronchial ultrasound guided sampling.  We also discussed the risks associated with the procedure which include a 2% risk of pneumothorax, infection, bleeding, and nondiagnostic procedure in detail.  I explained that patients typically are able to return home the same day of the procedure, but in rare cases admission to the hospital for observation and treatment is required.  After our discussion, the patient elected to proceed with the procedure  Recommendations:  - Pulmonary Function Test; Future - Procedural/ Surgical Case Request: ROBOTIC ASSISTED NAVIGATIONAL BRONCHOSCOPY; Future - CT SUPER D CHEST WO CONTRAST; Future - NM PET Image Initial (PI) Skull Base To Thigh (F-18 FDG); Future  I spent 60 minutes caring for this patient today,  including preparing to see the patient, obtaining a medical history , reviewing a separately obtained history, performing a medically appropriate examination and/or evaluation, counseling and educating the patient/family/caregiver, ordering medications, tests, or procedures, documenting clinical information in the electronic health record, and independently interpreting results (not separately reported/billed) and communicating results to the patient/family/caregiver  Vergia Glasgow, MD [place] [facility]  End of visit medications:  No orders of the defined types were placed in this encounter.    Current Outpatient Medications:    amLODipine (NORVASC) 10 MG tablet, Take 10 mg by mouth daily., Disp: , Rfl:    aspirin  EC 81 MG tablet, Take 81 mg by mouth daily. Swallow whole., Disp: , Rfl:    Calcium Carbonate (CALCIUM 500 PO), Take 1 tablet by mouth daily., Disp: , Rfl:    chlorthalidone (HYGROTON) 25 MG tablet, Take 12.5 mg by mouth daily., Disp: , Rfl:    Cholecalciferol (VITAMIN D) 50 MCG (2000 UT) tablet, Take 2,000 Units by mouth daily., Disp: , Rfl:    ciprofloxacin (CIPRO) 500 MG tablet, Take 500 mg by mouth 2 (two) times daily., Disp: , Rfl:    levothyroxine (SYNTHROID) 150 MCG tablet, Take 150 mcg by mouth daily before breakfast., Disp: , Rfl:    Multiple Vitamin (MULTIVITAMIN) capsule, Take 1 capsule by mouth daily., Disp: , Rfl:    multivitamin-lutein (OCUVITE-LUTEIN) CAPS capsule, Take 1 capsule by mouth daily., Disp: , Rfl:    Omega-3 Fatty Acids (FISH OIL) 1000 MG CAPS, Take 1,000 mg by mouth daily., Disp: , Rfl:    rosuvastatin (CRESTOR) 40 MG tablet, Take 40 mg by mouth daily., Disp: , Rfl:    tadalafil (CIALIS) 5 MG tablet, Take 5  mg by mouth daily as needed for erectile dysfunction., Disp: , Rfl:    tamsulosin  (FLOMAX ) 0.4 MG CAPS capsule, Take 0.4 mg by mouth daily., Disp: , Rfl:    clindamycin (CLEOCIN) 150 MG capsule, Take 150 mg by mouth 3 (three) times  daily. (Patient not taking: Reported on 08/30/2023), Disp: , Rfl:    Subjective:   PATIENT ID: Omar Hill GENDER: male DOB: 1950-05-25, MRN: [id number]  Chief Complaint  Patient presents with   New Patient (Initial Visit)    Right lower lobe nodule found on cardiac calcium CT, CT: 05/20/23 & 08/28/23  No breathing problems or symptoms, no cough, SOB, DOE. Snoring, restlessness, no gasping or witnessed apneas.    HPI  Patient is a pleasant 73 year old male presenting to clinic for the evaluation of a right lower lobe pulmonary nodule.  Patient is in his usual state of health and without any symptoms.  He denies any cough, shortness of breath, chest pain, chest tightness, fevers, chills, night sweats, or weight loss.  He underwent a coronary CTA in January 2025 that showed a right lower lobe pulmonary nodule with a mixed solid and subsolid component. This was further evaluated with a dedicated chest CT in April 2025.  The CT showed the spiculated part solid nodule in the right lower lobe with an adjacent satellite nodule concerning for bronchogenic carcinoma. Patient was referred to pulmonary for the evaluation of said nodule.  Patient has been followed closely by his cardiologist and underwent LHC given coronary CT suggesting high risk for CAD. LHC 07/2023 showed severe diffuse coronary calcifications for which medical management was recommended.  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 He denies any occupational exposures and denies any inhalational exposures.  Denies any smoking or vape use.  Ancillary information including prior medications, full medical/surgical/family/social histories, and PFTs (when available) are listed below and have been reviewed.   Review of Systems  Constitutional:  Negative for chills, fever and weight loss.  Respiratory:  Negative for cough, hemoptysis, sputum production and shortness of breath.   Cardiovascular:   Negative for chest pain and palpitations.     Objective:   Vitals:   08/30/23 1305  BP: 120/80  Pulse: 75  Temp: 98.8 F (37.1 C)  TempSrc: Oral  SpO2: 98%  Weight: 187 lb 12.8 oz (85.2 kg)  Height: 6' (1.829 m)   98% on RA  BMI Readings from Last 3 Encounters:  08/30/23 25.47 kg/m  07/31/23 26.78 kg/m  07/31/23 26.78 kg/m   Wt Readings from Last 3 Encounters:  08/30/23 187 lb 12.8 oz (85.2 kg)  07/31/23 192 lb (87.1 kg)  07/31/23 192 lb (87.1 kg)    Physical Exam Constitutional:      Appearance: Normal appearance.  HENT:     Head: Normocephalic and atraumatic.  Cardiovascular:     Rate and Rhythm: Normal rate and regular rhythm.     Pulses: Normal pulses.     Heart sounds: Normal heart sounds.  Pulmonary:     Effort: Pulmonary effort is normal.     Breath sounds: Normal breath sounds.  Musculoskeletal:     Cervical back: Normal range of motion and neck supple.  Neurological:     General: No focal deficit present.     Mental Status: He is alert and oriented to person, place, and time. Mental status is at baseline.     Ancillary Information    Past Medical History:  Diagnosis Date  BPH (benign prostatic hyperplasia)    Cervical spinal stenosis    Cervicalgia    CKD (chronic kidney disease) stage 2, GFR 60-89 ml/min    CKD (chronic kidney disease), stage III (HCC)    Graves disease    H/O prostate biopsy    History of radioactive iodine thyroid  ablation    HTN (hypertension)    Hyperlipidemia    Hypothyroidism    Osteopenia      Family History  Problem Relation Age of Onset   Lupus Mother    Cancer - Colon Father    Cancer - Prostate Father    Heart failure Sister    Sarcoidosis Sister      Past Surgical History:  Procedure Laterality Date   ACHILLES TENDON REPAIR Left    LEFT HEART CATH AND CORONARY ANGIOGRAPHY N/A 07/31/2023   Procedure: LEFT HEART CATH AND CORONARY ANGIOGRAPHY;  Surgeon: Knox Perl, MD;  Location: [facility];  Service: Cardiovascular;  Laterality: N/A;   TONSILS      Social History   Socioeconomic History   Marital status: Married    Spouse name: Not on file   Number of children: Not on file   Years of education: Not on file   Highest education level: Not on file  Occupational History   Not on file  Tobacco Use   Smoking status: Never   Smokeless tobacco: Never  Vaping Use   Vaping status: Never Used  Substance and Sexual Activity   Alcohol use: Yes    Comment: RARELY   Drug use: Never   Sexual activity: Never  Other Topics Concern   Not on file  Social History Narrative   Not on file   Social Drivers of Health   Financial Resource Strain: Not on file  Food Insecurity: Not on file  Transportation Needs: Not on file  Physical Activity: Not on file  Stress: Not on file  Social Connections: Not on file  Intimate Partner Violence: Not on file     Allergies  Allergen Reactions   Pollen Extract Cough     CBC    Component Value Date/Time   WBC 7.2 07/25/2023 1327   WBC 8.6 03/07/2022 2059   RBC 5.25 07/25/2023 1327   RBC 5.35 03/07/2022 2059   HGB 14.6 07/25/2023 1327   HCT 45.2 07/25/2023 1327   PLT 226 07/25/2023 1327   MCV 86 07/25/2023 1327   MCH 27.8 07/25/2023 1327   MCH 27.7 03/07/2022 2059   MCHC 32.3 07/25/2023 1327   MCHC 33.3 03/07/2022 2059   RDW 13.6 07/25/2023 1327    Pulmonary Functions Testing Results:     No data to display          Outpatient Medications Prior to Visit  Medication Sig Dispense Refill   amLODipine (NORVASC) 10 MG tablet Take 10 mg by mouth daily.     aspirin  EC 81 MG tablet Take 81 mg by mouth daily. Swallow whole.     Calcium Carbonate (CALCIUM 500 PO) Take 1 tablet by mouth daily.     chlorthalidone (HYGROTON) 25 MG tablet Take 12.5 mg by mouth daily.     Cholecalciferol (VITAMIN D) 50 MCG (2000 UT) tablet Take 2,000 Units by mouth daily.     ciprofloxacin (CIPRO) 500 MG tablet Take 500 mg by mouth 2 (two)  times daily.     levothyroxine (SYNTHROID) 150 MCG tablet Take 150 mcg by mouth daily before breakfast.     Multiple  Vitamin (MULTIVITAMIN) capsule Take 1 capsule by mouth daily.     multivitamin-lutein (OCUVITE-LUTEIN) CAPS capsule Take 1 capsule by mouth daily.     Omega-3 Fatty Acids (FISH OIL) 1000 MG CAPS Take 1,000 mg by mouth daily.     rosuvastatin (CRESTOR) 40 MG tablet Take 40 mg by mouth daily.     tadalafil (CIALIS) 5 MG tablet Take 5 mg by mouth daily as needed for erectile dysfunction.     tamsulosin  (FLOMAX ) 0.4 MG CAPS capsule Take 0.4 mg by mouth daily.     clindamycin (CLEOCIN) 150 MG capsule Take 150 mg by mouth 3 (three) times daily. (Patient not taking: Reported on 08/30/2023)     No facility-administered medications prior to visit.

## 2023-08-30 NOTE — Progress Notes (Unsigned)
 Cardiology Office Note:  .   Date:  08/31/2023  ID:  Omar Hill, DOB [birth year], MRN [id number] PCP: Glena Landau, MD  [facility] Providers Cardiologist:  None    History of Present Illness: .   Omar Hill is a 73 y.o. male with hx of HTN , HLD,  He had a coronary calcium score  of 2170 ( 93rd percentile for age / sex matched controls )   No CP , no dyspenea  His Coronary CT scan was ordered by his primary  He was started on Rosuvastatin when they received the results  He has HTN,   has been well controlled.  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  We discussed the PCSK9 inhibitors and also inclisiran.  He is not not inclined so much to give himself an injection.  We discussed the ease of getting inclisiran injections every 6 months.  Will explore that further.  I will see him in mid June for a follow-up appointment.  He has lipids that will be rechecked by his primary medical doctor later this month.   July 25, 2023 Omar Hill is seen for follow up of his LBBB, HTL Seem with wife, Donnamae Gaba in Feb. 2025 shows mildly reduced LV systolic function with EF 40-45% - there is some dyssynchrony from the LBBB Grade I DD  Myoview  study shows a defect c/w anterior apical MI vs.changes from his LBBB  He returns for further discussion  We had long discussion about left bundle branch block and the abnormalities that it causes.  We had a discussion about heart catheterization.    We discussed the risk, benefits, options of heart catheterization.  He understands and agrees to  proceed.   He has some degree of chronic kidney disease.  His last creatinine is 1.46.  My plan is to eventually have him stop the amlodipine and chlorthalidone and start him on ARB or Entresto , spironolactone , possibly Lasix.  I do not want to start any of these medications before the cath because I do not want his creatinine to be affected at this time.  Will anticipate having a return to see an APP in 4 to 6 weeks and at that time we can transition him from amlodipine to an ARB or entresto    He will avoid salt and processed foods.   August 31, 2023 Seen with wife, Omar Hill is seen for follow up of his CHF, The myoview  showed a fixed inf. Apical defect   Cath reveals severe diffuse CAC LAD :  80% mid LAD stenosis. The LAD wraps around the apex and supplies the inferior apical wall.  LCx:  mild disease RCA mild disease   He overall seems to be doing fairly well.  His creatinine has improved to 1.29.  Our plan for today is to discontinue the amlodipine and start him on Entresto  49-51 twice a day.  Will get a basic metabolic profile in 2 to 3 weeks.  Will have him see an  APP in approximately 6  weeks.  My plan is to stop chlorthalidone and start spironolactone assuming that his renal function has remained stable.    ROS:   Studies Reviewed: .         Risk Assessment/Calculations:       Physical Exam:     Physical Exam: Blood pressure 132/80, pulse 75, height 6' (1.829 m), weight 188 lb (85.3 kg), SpO2 99%.       GEN:  Well nourished, well developed in no acute distress HEENT: Normal NECK: No JVD; No carotid bruits LYMPHATICS: No lymphadenopathy CARDIAC: RRR , no murmurs, rubs, gallops RESPIRATORY:  Clear to auscultation without rales, wheezing or rhonchi  ABDOMEN: Soft, non-tender, non-distended MUSCULOSKELETAL:  No edema; No deformity  SKIN: Warm and dry NEUROLOGIC:  Alert and oriented x 3   ECG   EKG Interpretation Date/Time:  Thursday August 31 2023  16:05:54 EDT Ventricular Rate:  72 PR Interval:  200 QRS Duration:  178 QT Interval:  466 QTC Calculation: 510 R Axis:   -37  Text Interpretation: Normal sinus rhythm Left axis deviation Left bundle branch block When compared with ECG of 25-Jul-2023 12:43, T wave inversion less evident in Lateral leads Confirmed by Ahmad Alert ([id number]) on 08/31/2023 5:45:00 PM      ASSESSMENT AND PLAN: .   Coronary artery disease.  He has moderate to severe disease in the proximal LAD.  There is a likelihood that he has had a previous myocardial infarction.  The vessel is still patent.  He is not having any angina. Continue aspirin .    2.  Chronic HFrEF  Has had a LBBB for years ( he recalls having a myoview  test 15 + years ago  LV systolic function is mildly reduced with an EF of 40 to 45%.  Will discontinue the amlodipine.  Start Entresto  49-51 twice a day.  Will check metabolic profile in 2 weeks.  He will see an APP in approximately 4 weeks.  I will plan on seeing him last week of June.  I anticipate starting him on spironolactone and discontinuing chlorthalidone at that time assuming that his renal function is stable.   3.  Hyperlipidemia: Continue rosuvastatin.  His last LDL is 39.  Signed, Ahmad Alert, MD

## 2023-08-31 ENCOUNTER — Encounter: Payer: Self-pay | Admitting: Cardiovascular Disease

## 2023-08-31 ENCOUNTER — Ambulatory Visit: Attending: Cardiovascular Disease | Admitting: Cardiovascular Disease

## 2023-08-31 VITALS — BP 132/80 | HR 75 | Ht 72.0 in | Wt 188.0 lb

## 2023-08-31 DIAGNOSIS — E785 Hyperlipidemia, unspecified: Secondary | ICD-10-CM

## 2023-08-31 DIAGNOSIS — I10 Essential (primary) hypertension: Secondary | ICD-10-CM

## 2023-08-31 MED ORDER — ENTRESTO 49-51 MG PO TABS: 1.0000 | ORAL_TABLET | Freq: Two times a day (BID) | ORAL | 3 refills | Status: AC

## 2023-08-31 NOTE — Patient Instructions (Signed)
 Medication Instructions: STOP AMLODIPINE START ENTRESTO  49/51 MG TWICE A DAY  *If you need a refill on your cardiac medications before your next appointment, please call your pharmacy*  Lab Work:  BMET IN 2 WEEKS   If you have labs (blood work) drawn today and your tests are completely normal, you will receive your results only by: MyChart Message (if you have MyChart) OR A paper copy in the mail If you have any lab test that is abnormal or we need to change your treatment, we will call you to review the results.  Testing/Procedures:   Follow-Up: At [facility], you and your health needs are our priority.  As part of our continuing mission to provide you with exceptional heart care, our providers are all part of one team.  This team includes your primary Cardiologist (physician) and Advanced Practice Providers or APPs (Physician Assistants and Nurse Practitioners) who all work together to provide you with the care you need, when you need it.  Your next appointment:    [facility] IN 4 WEEKS  DR [facility] 10/30/23 AT 11:40 AM   We recommend signing up for the patient portal called "MyChart".  Sign up information is provided on this After Visit Summary.  MyChart is used to connect with patients for Virtual Visits (Telemedicine).  Patients are able to view lab/test results, encounter notes, upcoming appointments, etc.  Non-urgent messages can be sent to your provider as well.   To learn more about what you can do with MyChart, go to [email or web address].   Other Instructions       1st Floor: - Lobby - Registration  - Pharmacy  - Lab - Cafe  2nd Floor: - PV Lab - Diagnostic Testing (echo, CT, nuclear med)  3rd Floor: - Vacant  4th Floor: - TCTS (cardiothoracic surgery) - AFib Clinic - Structural Heart Clinic - Vascular Surgery  - Vascular Ultrasound  5th Floor: - HeartCare Cardiology (general and EP) - Clinical Pharmacy for coumadin, hypertension,  lipid, weight-loss medications, and med management appointments    Valet parking services will be available as well.

## 2023-09-04 ENCOUNTER — Telehealth: Payer: Self-pay

## 2023-09-04 ENCOUNTER — Ambulatory Visit: Admitting: Acute Care

## 2023-09-04 NOTE — Telephone Encounter (Signed)
 Melissa can you fax the form again with clinicals? Let me know if you need me to print them. Thank you!

## 2023-09-04 NOTE — Telephone Encounter (Signed)
 Copied from CRM [id number]. Topic: General - Other >> Sep 04, 2023 10:00 AM Juliana Ocean wrote: Reason for CRM: hailey from [facility] states she sent fax requesting records/clinical for his upcoming surgery.  She state we just faxed the form back to her, w/ nothing attached.  Please advise. Hailey states the are going to discard what we sent back, and will be looking for clinicals  Cb [phone] option 3

## 2023-09-05 ENCOUNTER — Telehealth: Payer: Self-pay

## 2023-09-05 ENCOUNTER — Ambulatory Visit (HOSPITAL_COMMUNITY)
Admission: RE | Admit: 2023-09-05 | Discharge: 2023-09-05 | Disposition: A | Discharge status: Home / Self Care | Source: Ambulatory Visit | Attending: Student in an Organized Health Care Education/Training Program | Admitting: Student in an Organized Health Care Education/Training Program

## 2023-09-05 DIAGNOSIS — R911 Solitary pulmonary nodule: Secondary | ICD-10-CM | POA: Insufficient documentation

## 2023-09-05 LAB — GLUCOSE, CAPILLARY: Glucose-Capillary: 79 mg/dL (ref 70–99)

## 2023-09-05 MED ORDER — FLUDEOXYGLUCOSE F - 18 (FDG) INJECTION
9.3700 | Freq: Once | INTRAVENOUS | Status: AC
Start: 2023-09-05 — End: 2023-09-05
  Administered 2023-09-05: 9.37 via INTRAVENOUS

## 2023-09-05 NOTE — Telephone Encounter (Signed)
   Pre-operative Risk Assessment    Patient Name: Omar Hill  DOB: May 10, 1950 MRN: [id number]   Date of last office visit: 08/31/2023, Dr. Ahmad Alert Date of next office visit: 09/27/2023, Marlyse Single, PA-C   Request for Surgical Clearance    Procedure:   Colonoscopy  Date of Surgery:  Clearance 09/20/23                                Surgeon: Dr. Lana Pina, MD Surgeon's Group or Practice Name: [facility], [place] Phone number: [phone] Fax number: [phone]   Type of Clearance Requested:   - Medical  - Pharmacy:  Hold Aspirin      Type of Anesthesia:   Propofol   Additional requests/questions:    SignedDelroy Fields   09/05/2023, 10:28 AM

## 2023-09-05 NOTE — Telephone Encounter (Signed)
     Primary Cardiologist: Dr. Alroy Aspen  Chart reviewed as part of pre-operative protocol coverage. Given past medical history and time since last visit, based on ACC/AHA guidelines, Byan Hiser Billinger would be at acceptable risk for the planned procedure without further cardiovascular testing.   Regarding ASA therapy, we recommend continuation of ASA throughout the perioperative period. However, if the surgeon feels that cessation of ASA is required in the perioperative period, it may be stopped 5-7 days prior to surgery with a plan to resume it as soon as felt to be feasible from a surgical standpoint in the post-operative period.    I will route this recommendation to the requesting party via Epic fax function and remove from pre-op pool.  Please call with questions. Chet Cota. Cherlynn Popiel NP-C     09/05/2023, 11:31 AM [facility] [address] Office [phone] Fax [phone]

## 2023-09-06 ENCOUNTER — Encounter
Admission: RE | Admit: 2023-09-06 | Discharge: 2023-09-06 | Disposition: A | Discharge status: Home / Self Care | Source: Ambulatory Visit | Attending: Student in an Organized Health Care Education/Training Program | Admitting: Student in an Organized Health Care Education/Training Program

## 2023-09-06 ENCOUNTER — Encounter: Payer: Self-pay | Admitting: Student in an Organized Health Care Education/Training Program

## 2023-09-06 ENCOUNTER — Other Ambulatory Visit: Payer: Self-pay

## 2023-09-06 DIAGNOSIS — I252 Old myocardial infarction: Secondary | ICD-10-CM | POA: Diagnosis not present

## 2023-09-06 DIAGNOSIS — I25119 Atherosclerotic heart disease of native coronary artery with unspecified angina pectoris: Secondary | ICD-10-CM

## 2023-09-06 DIAGNOSIS — I219 Acute myocardial infarction, unspecified: Secondary | ICD-10-CM

## 2023-09-06 DIAGNOSIS — I509 Heart failure, unspecified: Secondary | ICD-10-CM

## 2023-09-06 DIAGNOSIS — Z01812 Encounter for preprocedural laboratory examination: Secondary | ICD-10-CM

## 2023-09-06 DIAGNOSIS — Z79899 Other long term (current) drug therapy: Secondary | ICD-10-CM | POA: Diagnosis not present

## 2023-09-06 HISTORY — DX: Heart failure, unspecified: I50.9

## 2023-09-06 HISTORY — DX: Male erectile dysfunction, unspecified: N52.9

## 2023-09-06 HISTORY — DX: Solitary pulmonary nodule: R91.1

## 2023-09-06 HISTORY — DX: Atherosclerotic heart disease of native coronary artery without angina pectoris: I25.10

## 2023-09-06 HISTORY — DX: Acute myocardial infarction, unspecified: I21.9

## 2023-09-06 HISTORY — DX: Left bundle-branch block, unspecified: I44.7

## 2023-09-06 HISTORY — DX: Long term (current) use of aspirin: Z79.82

## 2023-09-06 LAB — BASIC METABOLIC PANEL WITH GFR
Anion gap: 7 (ref 5–15)
BUN: 23 mg/dL (ref 8–23)
CO2: 27 mmol/L (ref 22–32)
Calcium: 9.1 mg/dL (ref 8.9–10.3)
Chloride: 104 mmol/L (ref 98–111)
Creatinine, Ser: 1.32 mg/dL — ABNORMAL HIGH (ref 0.61–1.24)
GFR, Estimated: 57 mL/min — ABNORMAL LOW (ref >60)
Glucose, Bld: 106 mg/dL — ABNORMAL HIGH (ref 70–99)
Potassium: 3.4 mmol/L — ABNORMAL LOW (ref 3.5–5.1)
Sodium: 138 mmol/L (ref 135–145)

## 2023-09-06 NOTE — Patient Instructions (Signed)
 Your procedure is scheduled on:09-12-23 Tuesday Report to the Registration Desk on the 1st floor of the Medical Mall.Then proceed to the 2nd floor Surgery Desk To find out your arrival time, please call [phone] between 1PM - 3PM on:09-11-23 Monday If your arrival time is 6:00 am, do not arrive before that time as the Medical Mall entrance doors do not open until 6:00 am.  REMEMBER: Instructions that are not followed completely may result in serious medical risk, up to and including death; or upon the discretion of your surgeon and anesthesiologist your surgery may need to be rescheduled.  Do not eat food OR drink any liquids after midnight the night before surgery.  No gum chewing or hard candies.  One week prior to surgery:Stop NOW (09-06-23) Stop Anti-inflammatories (NSAIDS) such as Advil, Aleve, Ibuprofen, Motrin, Naproxen, Naprosyn and Aspirin  based products such as Excedrin, Goody's Powder, BC Powder. Stop ANY OVER THE COUNTER supplements until after surgery (Calcium, Vitamin C, Multivitamin, Ocuvite-Lutein, Fish Oil)  You may however, continue to take Tylenol  if needed for pain up until the day of surgery.  Stop tadalafil (CIALIS) 2 days prior to surgery-Last dose will be on 09-09-23 (Saturday)  Continue taking all of your other prescription medications up until the day of surgery.  ON THE DAY OF SURGERY ONLY TAKE THESE MEDICATIONS WITH SIPS OF WATER: -levothyroxine (SYNTHROID)  -tamsulosin  (FLOMAX )   Continue your 81 mg Aspirin  up until the day prior to surgery-Do NOT take the morning of surgery  No Alcohol for 24 hours before or after surgery.  No Smoking including e-cigarettes for 24 hours before surgery.  No chewable tobacco products for at least 6 hours before surgery.  No nicotine patches on the day of surgery.  Do not use any "recreational" drugs for at least a week (preferably 2 weeks) before your surgery.  Please be advised that the combination of cocaine and  anesthesia may have negative outcomes, up to and including death. If you test positive for cocaine, your surgery will be cancelled.  On the morning of surgery brush your teeth with toothpaste and water, you may rinse your mouth with mouthwash if you wish. Do not swallow any toothpaste or mouthwash.  Do not wear jewelry, make-up, hairpins, clips or nail polish.  For welded (permanent) jewelry: bracelets, anklets, waist bands, etc.  Please have this removed prior to surgery.  If it is not removed, there is a chance that hospital personnel will need to cut it off on the day of surgery.  Do not wear lotions, powders, or perfumes.   Do not shave body hair from the neck down 48 hours before surgery.  Contact lenses, hearing aids and dentures may not be worn into surgery.  Do not bring valuables to the hospital. [facility] is not responsible for any missing/lost belongings or valuables.   Notify your doctor if there is any change in your medical condition (cold, fever, infection).  Wear comfortable clothing (specific to your surgery type) to the hospital.  After surgery, you can help prevent lung complications by doing breathing exercises.  Take deep breaths and cough every 1-2 hours. Your doctor may order a device called an Incentive Spirometer to help you take deep breaths. When coughing or sneezing, hold a pillow firmly against your incision with both hands. This is called "splinting." Doing this helps protect your incision. It also decreases belly discomfort.  If you are being admitted to the hospital overnight, leave your suitcase in the car. After surgery  it may be brought to your room.  In case of increased patient census, it may be necessary for you, the patient, to continue your postoperative care in the Same Day Surgery department.  If you are being discharged the day of surgery, you will not be allowed to drive home. You will need a responsible individual to drive you home and  stay with you for 24 hours after surgery.   If you are taking public transportation, you will need to have a responsible individual with you.  Please call the Pre-admissions Testing Dept. at [phone] if you have any questions about these instructions.  Surgery Visitation Policy:  Patients having surgery or a procedure may have two visitors.  Children under the age of 41 must have an adult with them who is not the patient.

## 2023-09-08 ENCOUNTER — Encounter: Payer: Self-pay | Admitting: Student in an Organized Health Care Education/Training Program

## 2023-09-08 ENCOUNTER — Telehealth: Payer: Self-pay

## 2023-09-08 NOTE — Telephone Encounter (Signed)
 Copied from CRM [id number]. Topic: Clinical - Medical Advice >> Sep 08, 2023 10:23 AM Omar Hill wrote: Reason for CRM: Pt stated [employer] reached out to the patient stating his procedure is on 09/12/2023 with Dr. Darnelle Elders and there was a wrong code sent for the surgery. The code received for the robotic surgical procedure was S2900, however [employer] stated it was the incorrect code. Please contact before surgery. If you need to reach patient call [phone] ok to leave a vm.

## 2023-09-08 NOTE — Progress Notes (Signed)
 Perioperative / Anesthesia Services  Pre-Admission Testing Clinical Review / Pre-Operative Anesthesia Consult  Date: 09/08/23  Patient Demographics:  Name: Omar Hill DOB: [birth year] MRN:   [id number]  Planned Surgical Procedure(s):    Case: [id number] Date/Time: 09/12/23 1130   Procedure: ROBOTIC ASSISTED NAVIGATIONAL BRONCHOSCOPY (Bilateral)   Anesthesia type: General   Diagnosis: Lung nodule [R91.1]   Pre-op diagnosis: Lung nodule   Location: [facility] PROCEDURE RM 02 / [facility] ORS FOR ANESTHESIA GROUP   Surgeons: Vergia Glasgow, MD      NOTE: Available PAT nursing documentation and vital signs have been reviewed. Clinical nursing staff has updated patient's PMH/PSHx, current medication list, and drug allergies/intolerances to ensure comprehensive history available to assist in medical decision making as it pertains to the aforementioned surgical procedure and anticipated anesthetic course. Extensive review of available clinical information personally performed. [facility] and PSHx updated with any diagnoses/procedures that  may have been inadvertently omitted during his intake with the pre-admission testing department's nursing staff.  Clinical Discussion:  Omar Hill is a 73 y.o. male who is submitted for pre-surgical anesthesia review and clearance prior to him undergoing the above procedure. Patient has never been a smoker in the past. Pertinent PMH includes: CAD, questionable anterior MI, HFrEF, LBBB, aortic atherosclerosis HTN, HLD, Graves' disease (s/p I-131 ablation), postoperative hypothyroidism, CKD-III, RIGHT lower lobe pulmonary mass, OA, cervicalgia secondary to cervical spinal stenosis, lumbosacral DDD, BPH, nephrolithiasis.  Patient is followed by cardiology Floria Hurst, MD). He was last seen in the cardiology clinic on 08/31/2023; notes reviewed. At the time of his clinic visit, patient doing well overall from a cardiovascular perspective. Patient denied any chest  pain, shortness of breath, PND, orthopnea, palpitations, significant peripheral edema, weakness, fatigue, vertiginous symptoms, or presyncope/syncope. Patient with a past medical history significant for cardiovascular diagnoses. Documented physical exam was grossly benign, providing no evidence of acute exacerbation and/or decompensation of the patient's known cardiovascular conditions.  Coronary CTA was performed on 05/11/2023 that demonstrated an Agatston coronary artery calcium score of 2179. This placed patient in the 93rd percentile for age, sex, and race matched controls. Calcium depositions noted to be isolated in the LM (67), LAD (530), LCx (568), and RCA 1013) distributions.  Study demonstrates normal coronary origin with RIGHT dominance.  Additionally, aortic atherosclerosis was noted.  Most recent TTE performed on 07/07/2023 revealed a mildly reduced left ventricular systolic function with an EF of 40-45%.  Profound LBBB related left ventricular dyssynchrony; global hypokinesis.  There was mild concentric LVH. Left ventricular diastolic Doppler parameters consistent with abnormal relaxation (G1DD).  GLS -22.6.  Left atrium mildly dilated.  Right ventricular size and function normal with a TAPSE measuring 3.5 cm  (normal range >/= 1.6 cm).  Aortic valve sclerosis/calcification present.  There was trivial tricuspid valve regurgitation. All transvalvular gradients were noted to be normal providing no evidence suggestive of valvular stenosis. Aorta normal in size with no evidence of ectasia or aneurysmal dilatation.  Most recent myocardial perfusion imaging study was performed on 07/07/2023 revealing a mildly reduced left ventricular systolic function with an EF of 48%.  And systolic/diastolic cavity size mildly enlarged.  There was a medium fixed defect with mild to moderate reduction in uptake present in the apical to mid inferior and inferoseptal location.  Perfusion appears worse at rest likely  secondary to lower dose.  There was abnormal wall motion in the defect area consistent with prior infarction.  There was no evidence of transient ischemic dilatation (TID) noted.  Study determined to be intermediate risk.  Patient underwent diagnostic LEFT heart catheterization on 07/31/2023 revealing multivessel CAD; 30% proximal to mid LAD, 80% mid to distal LAD, 30% ostial to proximal LCx, 99% OM1, 80% OM 2, 30% mid RCA, 30% RPDA, and 50% RPAV.  Patient completely asymptomatic without chest pain or symptoms of heart failure.  Able to complete greater than 4 METS of physical activity in a brisk fashion without symptoms.  Despite areas of high-grade calcific stenosis, given that the patient was asymptomatic and able to exercise without cardiovascular limitation, the decision was made to defer intervention opting for aggressive medical management.  HFrEF being managed with GDMT including diuretic (chlorthalidone) and ARB/ARNI (Entresto ).  Blood pressure documented at 132/80 mmHg. Patient is on rosuvastatin for his HLD diagnosis and ASCVD prevention. In the setting of known cardiovascular diagnoses, it is important note that patient is on a PDE5i medication (tadalafil) for an erectile dysfunction diagnosis.  Patient is not diabetic. Patient does not have an OSAH diagnosis.  Patient active per baseline; walks 2 miles every other day, does yard work, participates in weightlifting.  Patient is able to complete all of his ADLs/IADLs without cardiovascular limitation.  Per the DASI, patient able to exceed 4 METS of physical activity without experiencing any significant angina/anginal equivalent symptoms. No changes were made to his medication regimen during his visit with cardiology.  Patient scheduled to follow-up with outpatient cardiology in 1 month or sooner if needed.  Omar Hill underwent cardiac CT for calcium scoring on 05/11/2023, at which time an incidental finding of a 1.4 cm low-attenuation  pulmonary nodule was noted.  Follow-up high-resolution chest CT performed on 08/09/2023 further characterize the nodule as being partially solid spiculated and measuring 1.7 x 2.5 cm with an internal 1.3 x 1.6 cm solid component.  PET/CT imaging revealed that nodule/mass was hypermetabolic with a maximum SUV of 3.7.  Patient was referred to pulmonary medicine for further evaluation and consideration of tissue sampling for definitive diagnosis and staging.  Patient has been scheduled for a ROBOTIC ASSISTED NAVIGATIONAL BRONCHOSCOPY on 09/12/2023 with Dr. Vergia Glasgow, MD. Given patient's past medical history significant for cardiovascular diagnoses, presurgical cardiac clearance was sought by the PAT team. Per cardiology, "based ACC/AHA guidelines, the patient's past medical history, and the amount of time since his last clinic visit, this patient would be at an overall ACCEPTABLE risk for the planned procedure without further cardiovascular testing or intervention at this time".   In review of the patient's chart, it is noted that he is on daily oral antithrombotic therapy. Given that patient's past medical history is significant for cardiovascular diagnoses, including but not limited to CAD, pulmonary medicine has cleared patient to continue his daily low dose ASA throughout his perioperative course.  Patient has been updated on these directives from his specialty care providers by the PAT team.  Patient denies previous perioperative complications with anesthesia in the past. In review his EMR, there are no records available for review pertaining to any anesthetic courses within the [facility] in the recent past.      08/31/2023    4:03 PM 08/30/2023    1:05 PM 07/31/2023    7:36 PM  Vitals with BMI  Height 6\' 0"  6\' 0"    Weight 188 lbs 187 lbs 13 oz   BMI 25.49 25.46   Systolic 132 120 045  Diastolic 80 80 66  Pulse 75 75 86   Providers/Specialists:  NOTE: Primary physician provider listed  below. Patient may have been seen by APP or partner within same practice.   PROVIDER ROLE / SPECIALTY LAST Florance Hun, MD Pulmonary Medicine (Surgeon) 08/30/2023  Glena Landau, MD Primary Care Provider 08/22/2023  Ahmad Alert, MD Cardiology 08/31/2023; preop APP call 09/05/2023  Rosana Comings, MD Endocrinology 09/20/2022   Allergies:   Allergies  Allergen Reactions   Pollen Extract Cough   Benadryl [Diphenhydramine] Other (See Comments)    Could not urinate-caused prostate issues   Mucinex Dm [Dm-Guaifenesin Er]     Could not urinate-caused prostate issues   Current Home Medications:   No current facility-administered medications for this encounter.    aspirin  EC 81 MG tablet   Calcium Carbonate (CALCIUM 500 PO)   chlorthalidone (HYGROTON) 25 MG tablet   Cholecalciferol (VITAMIN D) 50 MCG (2000 UT) tablet   levothyroxine (SYNTHROID) 150 MCG tablet   Multiple Vitamin (MULTIVITAMIN) capsule   multivitamin-lutein (OCUVITE-LUTEIN) CAPS capsule   Omega-3 Fatty Acids (FISH OIL) 1000 MG CAPS   rosuvastatin (CRESTOR) 40 MG tablet   sacubitril-valsartan (ENTRESTO ) 49-51 MG   tadalafil (CIALIS) 5 MG tablet   tamsulosin  (FLOMAX ) 0.4 MG CAPS capsule   History:   Past Medical History:  Diagnosis Date   BPH (benign prostatic hyperplasia)    Cervical spinal stenosis    Cervicalgia    CKD (chronic kidney disease), stage III (HCC)    Coronary artery disease    ED (erectile dysfunction)    a.) on PDE5i (tadalafil)   Graves disease    a.) s/p I-131 ablation   HFrEF (heart failure with reduced ejection fraction) (HCC)    HTN (hypertension)    Hyperlipidemia    Hypothyroidism    LBBB (left bundle branch block)    Long-term use of aspirin  therapy    Lung nodule seen on imaging study    Myocardial infarction (HCC)    Osteopenia    Prostatitis 07/2023   Past Surgical History:  Procedure Laterality Date   ACHILLES TENDON REPAIR Left    COLONOSCOPY     HAND TENDON  SURGERY Left    thumb   LEFT HEART CATH AND CORONARY ANGIOGRAPHY N/A 07/31/2023   Procedure: LEFT HEART CATH AND CORONARY ANGIOGRAPHY;  Surgeon: Knox Perl, MD;  Location: [facility] INVASIVE [facility];  Service: Cardiovascular;  Laterality: N/A;   TONSILLECTOMY     Family History  Problem Relation Age of Onset   Lupus Mother    Cancer - Colon Father    Cancer - Prostate Father    Heart failure Sister    Sarcoidosis Sister    Social History   Tobacco Use   Smoking status: Never   Smokeless tobacco: Never  Substance Use Topics   Alcohol use: Yes    Comment: RARELY   Pertinent Clinical Results:  LABS:  Lab Results  Component Value Date   WBC 7.2 07/25/2023   HGB 14.6 07/25/2023   HCT 45.2 07/25/2023   MCV 86 07/25/2023   PLT 226 07/25/2023   Hospital Outpatient Visit on 09/06/2023  Component Date Value Ref Range Status   Sodium 09/06/2023 138  135 - 145 mmol/L Final   Potassium 09/06/2023 3.4 (L)  3.5 - 5.1 mmol/L Final   Chloride 09/06/2023 104  98 - 111 mmol/L Final   CO2 09/06/2023 27  22 - 32 mmol/L Final   Glucose, Bld 09/06/2023 106 (H)  70 - 99 mg/dL Final   Glucose reference range applies only to samples taken after fasting for at  least 8 hours.   BUN 09/06/2023 23  8 - 23 mg/dL Final   Creatinine, Ser 09/06/2023 1.32 (H)  0.61 - 1.24 mg/dL Final   Calcium 69/62/9528 9.1  8.9 - 10.3 mg/dL Final   GFR, Estimated 09/06/2023 57 (L)  >60 mL/min Final   Comment: (NOTE) Calculated using the CKD-EPI Creatinine Equation (2021)    Anion gap 09/06/2023 7  5 - 15 Final   Performed at [facility], [address] [place], [place] [address]  Hospital Outpatient Visit on 09/05/2023  Component Date Value Ref Range Status   Glucose-Capillary 09/05/2023 79  70 - 99 mg/dL Final   Glucose reference range applies only to samples taken after fasting for at least 8 hours.    ECG: Date: 08/31/2023 Time ECG obtained: 1605 PM Rate: 72 bpm Rhythm:  Normal sinus rhythm;  LBBB Axis (leads I and aVF): left Intervals: PR 200 ms. QRS 178 ms. QTc 510 ms. ST segment and T wave changes: No evidence of acute T wave abnormalities or significant ST segment elevation or depression.  Evidence of a possible, age undetermined, prior infarct:  No Comparison: Similar to previous tracing obtained on 07/25/2023   IMAGING / PROCEDURES: SUPER D CHEST AND NM PET IMAGE INITIAL (PI) SKULL BASE TO THIGH (F-18 FDG) performed on 09/05/2023 Spiculated nodule along the right lower lobe is again seen and has mild abnormal radiotracer uptake of maximum SUV of 3.7. Malignant lesion is possible. Recommend further workup. No additional areas of abnormal uptake to suggest metastatic disease. There is a very large prostate with some significant uptake posteriorly along the prostate with some adjacent stranding. Please correlate for any known history including the patient's PSA. Further workup such as prostate MRI as clinically appropriate  CT CHEST HIGH RESOLUTION performed on 08/09/2023 Spiculated part solid nodule in the right lower lobe with an adjacent satellite nodule, findings indicative of primary bronchogenic carcinoma. These results will be called to the ordering clinician or representative by the Radiologist Assistant, and communication documented in the PACS or [employer]. No evidence of interstitial lung disease. Mild cylindrical bronchiectasis. Minimal air trapping, indicative of small airways disease. Possible punctate right renal stone. Aortic atherosclerosis  Coronary artery calcification.   CT CARDIAC SCORING performed on 05/11/2023 1.4 cm low-attenuation right lower lobe pulmonary nodule. Multiple hepatic cysts. Coronary calcium score of 2179. This was 93rd percentile for age-, race-, and sex-matched controls. Aortic atherosclerosis. Recommend aggressive risk factor modification, including LDL goal <70 and aspirin  81mg . Recommend cardiology  consultation.  TRANSTHORACIC ECHOCARDIOGRAM performed on 07/07/2023 There is profound LBBB-related left ventricular dyssynchrony. Left ventricular ejection fraction, by estimation, is 40 to 45%. The left ventricle has mildly decreased function. The left ventricle demonstrates global hypokinesis. There is mild concentric left ventricular hypertrophy. Left ventricular diastolic parameters are consistent with Grade I diastolic dysfunction (impaired relaxation). The average left ventricular global longitudinal strain is -22.2 %. The global longitudinal strain is normal.  Right ventricular systolic function is normal. The right ventricular size is normal. Tricuspid regurgitation signal is inadequate for assessing PA pressure.  Left atrial size was mildly dilated.  The mitral valve is normal in structure. No evidence of mitral valve regurgitation.  The aortic valve is tricuspid. There is mild calcification of the aortic valve. There is mild thickening of the aortic valve. Aortic valve regurgitation is not visualized. Aortic valve sclerosis/calcification is present, without any evidence of aortic stenosis.  The inferior vena cava is normal in size with greater than 50%  respiratory variability, suggesting right atrial pressure of 3 mmHg.   MYOCARDIAL PERFUSION IMAGING STUDY (LEXISCAN ) performed on 07/07/2023 Left ventricular function is abnormal. Global function is mildly reduced. Nuclear stress EF: 48%. The left ventricular ejection fraction is mildly decreased (45-54%). End diastolic cavity size is mildly enlarged. End systolic cavity size is mildly enlarged. No evidence of transient ischemic dilation (TID) noted.  LV perfusion is abnormal. There is no evidence of ischemia. There is evidence of infarction. Defect 1: There is a medium defect with mild-moderate reduction in uptake present in the apical to mid inferior and inferoseptal location(s) that is fixed. Perfusion appears worse at rest likely secondary to  lower dose. There is abnormal wall motion in the defect area. No ST deviation was noted.  Findings are consistent with infarction. The study is intermediate risk.   LEFT HEART CATHETERIZATION AND CORONARY ANGIOGRAPHY performed on 07/31/2023 LVEDP = 11 mmHg No pressure gradient across the aortic valve Multivessel CAD 30% proximal to mid LAD 80% mid to distal LAD 30% ostial to proximal LCx 99% OM1 80% OM2 30% mid RCA 30% RPDA 50% RPAV Recommendations Patient has angiographically significant mid LAD stenosis and also OM 2 branch of CX stenosis. However patient is completely asymptomatic without chest pain or symptoms of heart failure, states that he walks at least 30 minutes on a daily basis in a brisk fashion without any symptoms. In view of this, although has calcific high-grade stenosis in the mid LAD, would recommend medical therapy in the absence of symptoms.  As proximal LAD is not involved, would recommend continued medical therapy.    Impression and Plan:  Omar Hill has been referred for pre-anesthesia review and clearance prior to him undergoing the planned anesthetic and procedural courses. Available labs, pertinent testing, and imaging results were personally reviewed by me in preparation for upcoming operative/procedural course. [facility] medical record has been updated following extensive record review and patient interview with PAT staff.   This patient has been appropriately cleared by cardiology with an overall ACCEPTABLE risk of patient experiencing significant perioperative cardiovascular complications. Based on clinical review performed today (09/08/23), barring any significant acute changes in the patient's overall condition, it is anticipated that he will be able to proceed with the planned surgical intervention. Any acute changes in clinical condition may necessitate his procedure being postponed and/or cancelled. Patient will meet with anesthesia team (MD and/or  CRNA) on the day of his procedure for preoperative evaluation/assessment. Questions regarding anesthetic course will be fielded at that time.   Pre-surgical instructions were reviewed with the patient during his PAT appointment, and questions were fielded to satisfaction by PAT clinical staff. He has been instructed on which medications that he will need to hold prior to surgery, as well as the ones that have been deemed safe/appropriate to take on the day of his procedure. As part of the general education provided by PAT, patient made aware both verbally and in writing, that he would need to abstain from the use of any illegal substances during his perioperative course. He was advised that failure to follow the provided instructions could necessitate case cancellation or result in serious perioperative complications up to and including death. Patient encouraged to contact PAT and/or his surgeon's office to discuss any questions or concerns that may arise prior to surgery; verbalized understanding.   Renate Caroline, MSN, APRN, FNP-C, CEN [facility]  Perioperative Services Nurse Practitioner Phone: [phone] Fax: [phone] 09/08/23 9:09 AM  NOTE:  This note has been prepared using Scientist, clinical (histocompatibility and immunogenetics). Despite my best ability to proofread, there is always the potential that unintentional transcriptional errors may still occur from this process.

## 2023-09-11 MED ORDER — CHLORHEXIDINE GLUCONATE 0.12 % MT SOLN: 15.0000 mL | Freq: Once | OROMUCOSAL | Status: DC

## 2023-09-11 MED ORDER — LACTATED RINGERS IV SOLN: INTRAVENOUS | Status: DC

## 2023-09-11 MED ORDER — ORAL CARE MOUTH RINSE: 15.0000 mL | Freq: Once | OROMUCOSAL | Status: DC

## 2023-09-11 NOTE — Telephone Encounter (Signed)
 Now that we know not to use the code S2900 per an email Merritt Ables received I have called back to [facility] and spoke with Hailey again. She stated that since the code S2900 was denied it was removed anyway

## 2023-09-11 NOTE — Telephone Encounter (Signed)
 I called and spoke with Hailey with [employer] and she stated the code S2900 is not payable by Medicare and they should use a different code

## 2023-09-11 NOTE — Telephone Encounter (Signed)
 Me     09/11/23  2:50 PM Note Per Almira Armour, procedure has been approved.     Nothing further needed.         09/11/23  2:39 PM Belita Bowling S routed this conversation to Me  Natale Bail   09/11/23  2:39 PM Note Now that we know not to use the code S2900 per an email Merritt Ables received I have called back to [employer] and spoke with Hailey again. She stated that since the code S2900 was denied it was removed anyway

## 2023-09-11 NOTE — Telephone Encounter (Signed)
 I never knew where the form was but I did fax the records to [facility] on 09/04/2023 but they still only partially approved

## 2023-09-11 NOTE — Telephone Encounter (Signed)
 Per Almira Armour, procedure has been approved.   Nothing further needed.

## 2023-09-11 NOTE — Telephone Encounter (Signed)
 For the codes 16109, S2900 Auth # [id number] valid 08/30/23 to 11/28/2023 [employer] states Partial Approved not sure what that means. I had to fax records.

## 2023-09-12 ENCOUNTER — Encounter: Payer: Self-pay | Admitting: Student in an Organized Health Care Education/Training Program

## 2023-09-12 ENCOUNTER — Ambulatory Visit
Admission: RE | Admit: 2023-09-12 | Discharge: 2023-09-12 | Disposition: A | Discharge status: Home / Self Care | Attending: Student in an Organized Health Care Education/Training Program | Admitting: Student in an Organized Health Care Education/Training Program

## 2023-09-12 ENCOUNTER — Ambulatory Visit

## 2023-09-12 ENCOUNTER — Encounter
Admission: RE | Disposition: A | Discharge status: Home / Self Care | Payer: Self-pay | Source: Home / Self Care | Attending: Student in an Organized Health Care Education/Training Program

## 2023-09-12 ENCOUNTER — Ambulatory Visit: Payer: Self-pay | Admitting: Urgent Care

## 2023-09-12 ENCOUNTER — Other Ambulatory Visit: Payer: Self-pay

## 2023-09-12 DIAGNOSIS — R846 Abnormal cytological findings in specimens from respiratory organs and thorax: Secondary | ICD-10-CM | POA: Diagnosis not present

## 2023-09-12 DIAGNOSIS — I5022 Chronic systolic (congestive) heart failure: Secondary | ICD-10-CM | POA: Insufficient documentation

## 2023-09-12 DIAGNOSIS — I700 Atherosclerosis of aorta: Secondary | ICD-10-CM | POA: Insufficient documentation

## 2023-09-12 DIAGNOSIS — I130 Hypertensive heart and chronic kidney disease with heart failure and stage 1 through stage 4 chronic kidney disease, or unspecified chronic kidney disease: Secondary | ICD-10-CM | POA: Insufficient documentation

## 2023-09-12 DIAGNOSIS — I2510 Atherosclerotic heart disease of native coronary artery without angina pectoris: Secondary | ICD-10-CM | POA: Insufficient documentation

## 2023-09-12 DIAGNOSIS — N400 Enlarged prostate without lower urinary tract symptoms: Secondary | ICD-10-CM | POA: Diagnosis not present

## 2023-09-12 DIAGNOSIS — I447 Left bundle-branch block, unspecified: Secondary | ICD-10-CM | POA: Insufficient documentation

## 2023-09-12 DIAGNOSIS — I10 Essential (primary) hypertension: Secondary | ICD-10-CM | POA: Diagnosis not present

## 2023-09-12 DIAGNOSIS — M1990 Unspecified osteoarthritis, unspecified site: Secondary | ICD-10-CM | POA: Insufficient documentation

## 2023-09-12 DIAGNOSIS — E785 Hyperlipidemia, unspecified: Secondary | ICD-10-CM | POA: Diagnosis not present

## 2023-09-12 DIAGNOSIS — R911 Solitary pulmonary nodule: Secondary | ICD-10-CM | POA: Insufficient documentation

## 2023-09-12 DIAGNOSIS — Z48813 Encounter for surgical aftercare following surgery on the respiratory system: Secondary | ICD-10-CM | POA: Diagnosis not present

## 2023-09-12 DIAGNOSIS — Z8249 Family history of ischemic heart disease and other diseases of the circulatory system: Secondary | ICD-10-CM | POA: Diagnosis not present

## 2023-09-12 DIAGNOSIS — C3431 Malignant neoplasm of lower lobe, right bronchus or lung: Secondary | ICD-10-CM | POA: Diagnosis not present

## 2023-09-12 DIAGNOSIS — N183 Chronic kidney disease, stage 3 unspecified: Secondary | ICD-10-CM | POA: Diagnosis not present

## 2023-09-12 DIAGNOSIS — E890 Postprocedural hypothyroidism: Secondary | ICD-10-CM | POA: Insufficient documentation

## 2023-09-12 HISTORY — DX: Other intervertebral disc degeneration, lumbosacral region without mention of lumbar back pain or lower extremity pain: M51.379

## 2023-09-12 HISTORY — DX: Atherosclerosis of aorta: I70.0

## 2023-09-12 HISTORY — DX: Calculus of kidney: N20.0

## 2023-09-12 HISTORY — DX: Postprocedural hypothyroidism: E89.0

## 2023-09-12 HISTORY — PX: BRONCHOSCOPY, WITH BIOPSY USING ELECTROMAGNETIC NAVIGATION: SHX7536

## 2023-09-12 HISTORY — DX: Other specified diseases of liver: K76.89

## 2023-09-12 HISTORY — DX: Unspecified systolic (congestive) heart failure: I50.20

## 2023-09-12 SURGERY — BRONCHOSCOPY, WITH BIOPSY USING ELECTROMAGNETIC NAVIGATION
Anesthesia: General | Laterality: Bilateral

## 2023-09-12 MED ORDER — PHENYLEPHRINE HCL-NACL 20-0.9 MG/250ML-% IV SOLN
INTRAVENOUS | Status: DC | PRN
  Administered 2023-09-12: 50 ug/min via INTRAVENOUS

## 2023-09-12 MED ORDER — EPHEDRINE SULFATE-NACL 50-0.9 MG/10ML-% IV SOSY
PREFILLED_SYRINGE | INTRAVENOUS | Status: DC | PRN
  Administered 2023-09-12: 5 mg via INTRAVENOUS

## 2023-09-12 MED ORDER — CHLORHEXIDINE GLUCONATE 0.12 % MT SOLN
OROMUCOSAL | Status: AC
Start: 2023-09-12 — End: ?
  Filled 2023-09-12: qty 15

## 2023-09-12 MED ORDER — PHENYLEPHRINE 80 MCG/ML (10ML) SYRINGE FOR IV PUSH (FOR BLOOD PRESSURE SUPPORT)
PREFILLED_SYRINGE | INTRAVENOUS | Status: AC
  Filled 2023-09-12: qty 10

## 2023-09-12 MED ORDER — PROPOFOL 10 MG/ML IV BOLUS
INTRAVENOUS | Status: DC | PRN
  Administered 2023-09-12: 20 mg via INTRAVENOUS
  Administered 2023-09-12: 120 mg via INTRAVENOUS
  Administered 2023-09-12: 20 mg via INTRAVENOUS

## 2023-09-12 MED ORDER — LIDOCAINE HCL (CARDIAC) PF 100 MG/5ML IV SOSY
PREFILLED_SYRINGE | INTRAVENOUS | Status: DC | PRN
  Administered 2023-09-12: 80 mg via INTRAVENOUS

## 2023-09-12 MED ORDER — PHENYLEPHRINE 80 MCG/ML (10ML) SYRINGE FOR IV PUSH (FOR BLOOD PRESSURE SUPPORT)
PREFILLED_SYRINGE | INTRAVENOUS | Status: DC | PRN
  Administered 2023-09-12: 80 ug via INTRAVENOUS
  Administered 2023-09-12: 160 ug via INTRAVENOUS
  Administered 2023-09-12 (×3): 80 ug via INTRAVENOUS
  Administered 2023-09-12: 160 ug via INTRAVENOUS
  Administered 2023-09-12: 80 ug via INTRAVENOUS

## 2023-09-12 MED ORDER — PHENYLEPHRINE HCL-NACL 20-0.9 MG/250ML-% IV SOLN
INTRAVENOUS | Status: AC
  Filled 2023-09-12: qty 250

## 2023-09-12 MED ORDER — SUGAMMADEX SODIUM 200 MG/2ML IV SOLN
INTRAVENOUS | Status: DC | PRN
  Administered 2023-09-12: 200 mg via INTRAVENOUS

## 2023-09-12 MED ORDER — FENTANYL CITRATE (PF) 100 MCG/2ML IJ SOLN
INTRAMUSCULAR | Status: AC
Start: 2023-09-12 — End: ?
  Filled 2023-09-12: qty 2

## 2023-09-12 MED ORDER — ROCURONIUM BROMIDE 100 MG/10ML IV SOLN
INTRAVENOUS | Status: DC | PRN
  Administered 2023-09-12: 50 mg via INTRAVENOUS
  Administered 2023-09-12: 10 mg via INTRAVENOUS

## 2023-09-12 MED ORDER — ONDANSETRON HCL 4 MG/2ML IJ SOLN
INTRAMUSCULAR | Status: DC | PRN
  Administered 2023-09-12: 4 mg via INTRAVENOUS

## 2023-09-12 MED ORDER — EPHEDRINE 5 MG/ML INJ
INTRAVENOUS | Status: AC
  Filled 2023-09-12: qty 5

## 2023-09-12 MED ORDER — DEXAMETHASONE SODIUM PHOSPHATE 10 MG/ML IJ SOLN
INTRAMUSCULAR | Status: DC | PRN
  Administered 2023-09-12: 5 mg via INTRAVENOUS

## 2023-09-12 MED ORDER — EPHEDRINE SULFATE (PRESSORS) 50 MG/ML IJ SOLN: INTRAMUSCULAR | Status: DC | PRN

## 2023-09-12 MED ORDER — PROPOFOL 500 MG/50ML IV EMUL
INTRAVENOUS | Status: DC | PRN
  Administered 2023-09-12: 130 ug/kg/min via INTRAVENOUS

## 2023-09-12 MED ORDER — PROPOFOL 1000 MG/100ML IV EMUL
INTRAVENOUS | Status: AC
Start: 2023-09-12 — End: ?
  Filled 2023-09-12: qty 100

## 2023-09-12 MED ORDER — LIDOCAINE HCL (PF) 2 % IJ SOLN
INTRAMUSCULAR | Status: AC
  Filled 2023-09-12: qty 5

## 2023-09-12 MED ORDER — PROPOFOL 10 MG/ML IV BOLUS
INTRAVENOUS | Status: AC
  Filled 2023-09-12: qty 20

## 2023-09-12 MED ORDER — NOREPINEPHRINE 4 MG/250ML-% IV SOLN
INTRAVENOUS | Status: AC
  Filled 2023-09-12: qty 250

## 2023-09-12 NOTE — Anesthesia Procedure Notes (Signed)
 Procedure Name: Intubation Date/Time: 09/12/2023 11:59 AM  Performed by: Philippe Brazen, CRNAPre-anesthesia Checklist: Patient identified, Emergency Drugs available, Suction available and Patient being monitored Patient Re-evaluated:Patient Re-evaluated prior to induction Oxygen Delivery Method: Circle system utilized Preoxygenation: Pre-oxygenation with 100% oxygen Induction Type: IV induction Ventilation: Mask ventilation without difficulty Laryngoscope Size: McGrath and 4 Grade View: Grade II Tube type: Oral Tube size: 8.5 mm Number of attempts: 1 Airway Equipment and Method: Stylet and Oral airway Placement Confirmation: ETT inserted through vocal cords under direct vision, positive ETCO2 and breath sounds checked- equal and bilateral Secured at: 23 cm Tube secured with: Tape Dental Injury: Teeth and Oropharynx as per pre-operative assessment

## 2023-09-12 NOTE — Op Note (Signed)
 Video Bronchoscopy with Robotic Assisted Bronchoscopic Navigation   Date of Operation: 09/12/2023   Pre-op Diagnosis: RLL nodule  Surgeon: Vergia Glasgow,  MD  Anesthesia: General endotracheal anesthesia  Operation: Flexible video fiberoptic bronchoscopy with robotic assistance and biopsies.  Estimated Blood Loss: Minimal  Complications: None  Indications and History: Omar Hill is a 73 y.o. male with history of of a RLL that was found on a coronary CT presenting for biopsy.  Recommendation made to achieve a tissue diagnosis via robotic assisted navigational bronchoscopy.  The risks, benefits, complications, treatment options and expected outcomes were discussed with the patient.  The possibilities of pneumothorax, pneumonia, reaction to medication, pulmonary aspiration, perforation of a viscus, bleeding, failure to diagnose a condition and creating a complication requiring transfusion or operation were discussed with the patient who freely signed the consent.    Description of Procedure: The patient was seen in the Preoperative Area, was examined and was deemed appropriate to proceed.  The patient was taken to [facility] Endoscopy room 3, identified as Omar Hill and the procedure verified as Flexible Video Fiberoptic Bronchoscopy.  A Time Out was held and the above information confirmed.   Prior to the date of the procedure a high-resolution CT scan of the chest was performed. Utilizing ION software program a virtual tracheobronchial tree was generated to allow the creation of distinct navigation pathways to the patient's parenchymal abnormalities. After being taken to the operating room general anesthesia was initiated and the patient  was orally intubated. The video fiberoptic bronchoscope was introduced via the endotracheal tube and a general inspection was performed which showed normal right and left lung anatomy. Aspiration of the bilateral mainstems was completed to remove any  remaining secretions. Robotic catheter inserted into patient's endotracheal tube.   Target #1 RLL nodule: The distinct navigation pathways prepared prior to this procedure were then utilized to navigate to patient's lesion identified on CT scan. The robotic catheter was secured into place and the vision probe was withdrawn. Lesion location was approximated using fluoroscopy.  We then used radial EBUS to confirm that we were in the vicinity of the lesion and a concentric image was obtained. Local registration and targeting was performed using GE OEC 3D mobile C-arm three-dimensional imaging. Under fluoroscopic guidance transbronchial brushings, transbronchial needle biopsies, and transbronchial forceps biopsies were performed to be sent for culture, cytology and pathology.  Needle-in-lesion was confirmed using GE OEC 3D mobile C-arm.  A bronchioalveolar lavage was performed in the RLL and sent for microbiology.    At the end of the procedure a general airway inspection was performed and there was no evidence of active bleeding. An EBUS bronchoscopy was then introduced and the hilar and mediastinal lymph nodes stations were examined, and none were found to be enlarged. The bronchoscope was removed.  The patient tolerated the procedure well. There was no significant blood loss and there were no obvious complications. A post-procedural chest x-ray is pending.  Samples Target #1: RLL nodule 1. Transbronchial brushings from RLL 2. Transbronchial needle biopsies from RLL 3. Transbronchial forceps biopsies from RLL 4. Bronchoalveolar lavage from RLL  Tool in lesion confirmed on two separate occasions:      Plans:  The patient will be discharged from the PACU to home when recovered from anesthesia and after chest x-ray is reviewed. We will review the cytology, pathology and microbiology results with the patient when they become available. Outpatient followup will be with myself.  Vergia Glasgow,  MD [facility]  Critical [facility] 09/12/2023 1:01 PM

## 2023-09-12 NOTE — Interval H&P Note (Signed)
 S: feels well, no chest pain or shortness of breath, no hemoptysis  O: Vitals:   09/12/23 1027  BP: 136/84  Pulse: (!) 56  Resp: 17  Temp: 98 F (36.7 C)  SpO2: 98%   General: Well-appearing and in no distress. Well-nourished Eyes: Anicteric, no conjunctival pallor HEENT: Mucous membranes moist, no evidence of postnasal drip Lymphadenopathy: No cervical or supraclavicular adenopathy Respiratory: Trachea is midline, no respiratory distress, good bilateral air entry, no wheezes, rales, or rhonchi Cardiovascular: Heart with regular rate and rhythm, normal S1 and S2, no murmurs, rubs, or gallops Gastrointestinal: Normoactive bowel sounds, soft and nontender Musculoskeletal: No clubbing or digital cyanosis, normal range of motion Skin: No rashes on limited exam Neuro: Alert and oriented, no gross focal deficits  A/P: 73 year old male presenting with a RLL pulmonary nodule fond incidentally on coronary CT. He is presenting for robotic assisted navigational bronchoscopy for tissue acquisition and EBUS +/- TBNA for staging of the mediastinum. Risks and benefits discussed, he is appropriate for the procedure.  Vergia Glasgow, MD [place] [facility] 09/12/2023 11:36 AM

## 2023-09-12 NOTE — Transfer of Care (Signed)
 Immediate Anesthesia Transfer of Care Note  Patient: Omar Hill  Procedure(s) Performed: ROBOTIC ASSISTED NAVIGATIONAL BRONCHOSCOPY (Bilateral)  Patient Location: PACU  Anesthesia Type:General  Level of Consciousness: awake and patient cooperative  Airway & Oxygen Therapy: Patient Spontanous Breathing  Post-op Assessment: Report given to RN and Post -op Vital signs reviewed and stable  Post vital signs: Reviewed and stable  Last Vitals:  Vitals Value Taken Time  BP 92/45 09/12/23 1317  Temp 36.5 C 09/12/23 1311  Pulse 70 09/12/23 1323  Resp 13 09/12/23 1323  SpO2 98 % 09/12/23 1323  Vitals shown include unfiled device data.  Last Pain:  Vitals:   09/12/23 1027  PainSc: 0-No pain         Complications: No notable events documented.

## 2023-09-12 NOTE — Anesthesia Preprocedure Evaluation (Signed)
 Anesthesia Evaluation  Patient identified by MRN, date of birth, ID band Patient awake    Reviewed: Allergy & Precautions, NPO status , Patient's Chart, lab work & pertinent test results  History of Anesthesia Complications (+) history of anesthetic complications  Airway Mallampati: III  TM Distance: >3 FB Neck ROM: full    Dental  (+) Chipped   Pulmonary neg pulmonary ROS, neg shortness of breath   Pulmonary exam normal        Cardiovascular Exercise Tolerance: Good hypertension, (-) angina + CAD and + Past MI  (-) DOE Normal cardiovascular exam+ dysrhythmias      Neuro/Psych negative neurological ROS  negative psych ROS   GI/Hepatic negative GI ROS, Neg liver ROS,,,  Endo/Other  Hypothyroidism    Renal/GU Renal disease     Musculoskeletal   Abdominal   Peds  Hematology negative hematology ROS (+)   Anesthesia Other Findings Past Medical History: No date: Aortic atherosclerosis (HCC) No date: BPH (benign prostatic hyperplasia) No date: Cervical spinal stenosis No date: Cervicalgia No date: CKD (chronic kidney disease), stage III (HCC) No date: Coronary artery disease No date: DDD (degenerative disc disease), lumbosacral No date: ED (erectile dysfunction)     Comment:  a.) on PDE5i (tadalafil) No date: Graves disease     Comment:  a.) s/p I-131 ablation No date: Hepatic cyst (multiple) No date: HFrEF (heart failure with reduced ejection fraction) (HCC) No date: HTN (hypertension) No date: Hyperlipidemia No date: LBBB (left bundle branch block) No date: Long-term use of aspirin  therapy No date: Myocardial infarction (HCC) No date: Nephrolithiasis No date: Osteopenia No date: Postoperative hypothyroidism 07/2023: Prostatitis 05/11/2023: Right lower lobe pulmonary nodule     Comment:  a.) CT chest 05/11/2023: spiculated 1.4 cm RLL mass; b.)              high resolution CT chest 08/09/2023: partially  solid               spiculated nodule anterior RLL measuring 1.7 x 2.5 cm               with an internal 1.3 x 1.6 cm solid component; adjacent               satellite ground-glass nodule along the posterior margin               measuring 5 mm; c.) PET CT 09/05/2023: hypermetabolic RLL              spiculated nodule with max SUV 3.7  Past Surgical History: No date: ACHILLES TENDON REPAIR; Left No date: COLONOSCOPY No date: HAND TENDON SURGERY; Left     Comment:  thumb 07/31/2023: LEFT HEART CATH AND CORONARY ANGIOGRAPHY; N/A     Comment:  Procedure: LEFT HEART CATH AND CORONARY ANGIOGRAPHY;                Surgeon: Knox Perl, MD;  Location: [facility];                Service: Cardiovascular;  Laterality: N/A; No date: TONSILLECTOMY  BMI    Body Mass Index: 25.23 kg/m      Reproductive/Obstetrics negative OB ROS                             Anesthesia Physical Anesthesia Plan  ASA: 3  Anesthesia Plan: General ETT   Post-op Pain Management:    Induction: Intravenous  PONV Risk Score and Plan: Ondansetron , Dexamethasone, Midazolam  and Treatment may vary due to age or medical condition  Airway Management Planned: Oral ETT  Additional Equipment:   Intra-op Plan:   Post-operative Plan: Extubation in OR  Informed Consent: I have reviewed the patients History and Physical, chart, labs and discussed the procedure including the risks, benefits and alternatives for the proposed anesthesia with the patient or authorized representative who has indicated his/her understanding and acceptance.     Dental Advisory Given  Plan Discussed with: Anesthesiologist, CRNA and Surgeon  Anesthesia Plan Comments: (Patient consented for risks of anesthesia including but not limited to:  - adverse reactions to medications - damage to eyes, teeth, lips or other oral mucosa - nerve damage due to positioning  - sore throat or hoarseness - Damage to heart,  brain, nerves, lungs, other parts of body or loss of life  Patient voiced understanding and assent.)       Anesthesia Quick Evaluation

## 2023-09-12 NOTE — Anesthesia Postprocedure Evaluation (Signed)
 Anesthesia Post Note  Patient: Omar Hill  Procedure(s) Performed: ROBOTIC ASSISTED NAVIGATIONAL BRONCHOSCOPY (Bilateral)  Patient location during evaluation: PACU Anesthesia Type: General Level of consciousness: awake and alert Pain management: pain level controlled Vital Signs Assessment: post-procedure vital signs reviewed and stable Respiratory status: spontaneous breathing, nonlabored ventilation, respiratory function stable and patient connected to nasal cannula oxygen Cardiovascular status: blood pressure returned to baseline and stable Postop Assessment: no apparent nausea or vomiting Anesthetic complications: no Comments: Patient was able to void 300+ cc in PACU   No notable events documented.   Last Vitals:  Vitals:   09/12/23 1315 09/12/23 1324  BP: (!) 92/45 93/69  Pulse: 75 70  Resp: 20 13  Temp:    SpO2: 100% 98%    Last Pain:  Vitals:   09/12/23 1324  PainSc: 0-No pain                 Portia Brittle Vidalia Serpas

## 2023-09-13 ENCOUNTER — Encounter: Payer: Self-pay | Admitting: Student in an Organized Health Care Education/Training Program

## 2023-09-14 LAB — ACID FAST SMEAR (AFB, MYCOBACTERIA)
Acid Fast Smear: NEGATIVE
Acid Fast Smear: NEGATIVE

## 2023-09-14 LAB — SURGICAL PATHOLOGY

## 2023-09-14 LAB — CYTOLOGY - NON PAP

## 2023-09-15 ENCOUNTER — Other Ambulatory Visit: Payer: Self-pay | Admitting: Student in an Organized Health Care Education/Training Program

## 2023-09-15 DIAGNOSIS — C3491 Malignant neoplasm of unspecified part of right bronchus or lung: Secondary | ICD-10-CM

## 2023-09-16 LAB — CULTURE, RESPIRATORY W GRAM STAIN
Culture: NO GROWTH
Culture: NO GROWTH
Gram Stain: NONE SEEN
Gram Stain: NONE SEEN

## 2023-09-18 ENCOUNTER — Telehealth: Payer: Self-pay | Admitting: Student in an Organized Health Care Education/Training Program

## 2023-09-18 DIAGNOSIS — I10 Essential (primary) hypertension: Secondary | ICD-10-CM | POA: Diagnosis not present

## 2023-09-18 DIAGNOSIS — E785 Hyperlipidemia, unspecified: Secondary | ICD-10-CM | POA: Diagnosis not present

## 2023-09-18 NOTE — Telephone Encounter (Signed)
 Pt not available will call back to schedule pft can be any location

## 2023-09-19 ENCOUNTER — Ambulatory Visit: Admitting: Student in an Organized Health Care Education/Training Program

## 2023-09-19 ENCOUNTER — Ambulatory Visit

## 2023-09-19 DIAGNOSIS — M9901 Segmental and somatic dysfunction of cervical region: Secondary | ICD-10-CM | POA: Diagnosis not present

## 2023-09-19 DIAGNOSIS — C3491 Malignant neoplasm of unspecified part of right bronchus or lung: Secondary | ICD-10-CM

## 2023-09-19 DIAGNOSIS — M25461 Effusion, right knee: Secondary | ICD-10-CM | POA: Diagnosis not present

## 2023-09-19 DIAGNOSIS — M62830 Muscle spasm of back: Secondary | ICD-10-CM | POA: Diagnosis not present

## 2023-09-19 DIAGNOSIS — R911 Solitary pulmonary nodule: Secondary | ICD-10-CM

## 2023-09-19 DIAGNOSIS — M25561 Pain in right knee: Secondary | ICD-10-CM | POA: Diagnosis not present

## 2023-09-19 LAB — PULMONARY FUNCTION TEST
DL/VA % pred: 90 %
DL/VA: 3.56 ml/min/mmHg/L
DLCO unc % pred: 94 %
DLCO unc: 25.57 ml/min/mmHg
FEF 25-75 Post: 5.23 L/s
FEF 25-75 Pre: 3.9 L/s
FEF2575-%Change-Post: 34 %
FEF2575-%Pred-Post: 203 %
FEF2575-%Pred-Pre: 151 %
FEV1-%Change-Post: 7 %
FEV1-%Pred-Post: 108 %
FEV1-%Pred-Pre: 101 %
FEV1-Post: 3.75 L
FEV1-Pre: 3.48 L
FEV1FVC-%Change-Post: 6 %
FEV1FVC-%Pred-Pre: 111 %
FEV6-%Change-Post: 1 %
FEV6-%Pred-Post: 96 %
FEV6-%Pred-Pre: 95 %
FEV6-Post: 4.29 L
FEV6-Pre: 4.23 L
FEV6FVC-%Pred-Post: 105 %
FEV6FVC-%Pred-Pre: 105 %
FVC-%Change-Post: 1 %
FVC-%Pred-Post: 92 %
FVC-%Pred-Pre: 91 %
FVC-Post: 4.33 L
FVC-Pre: 4.28 L
Post FEV1/FVC ratio: 87 %
Post FEV6/FVC ratio: 100 %
Pre FEV1/FVC ratio: 81 %
Pre FEV6/FVC Ratio: 100 %
RV % pred: 129 %
RV: 3.36 L
TLC % pred: 109 %
TLC: 8.16 L

## 2023-09-19 LAB — BASIC METABOLIC PANEL WITH GFR
BUN/Creatinine Ratio: 5 — ABNORMAL LOW (ref 10–24)
BUN: 6 mg/dL — ABNORMAL LOW (ref 8–27)
CO2: 24 mmol/L (ref 20–29)
Calcium: 9 mg/dL (ref 8.6–10.2)
Chloride: 101 mmol/L (ref 96–106)
Creatinine, Ser: 1.23 mg/dL (ref 0.76–1.27)
Glucose: 115 mg/dL — ABNORMAL HIGH (ref 70–99)
Potassium: 4 mmol/L (ref 3.5–5.2)
Sodium: 138 mmol/L (ref 134–144)
eGFR: 62 mL/min/{1.73_m2} (ref >59)

## 2023-09-19 NOTE — Progress Notes (Signed)
 Full PFT performed today.

## 2023-09-19 NOTE — Progress Notes (Signed)
 Assessment & Plan:   1. Non-small cell cancer of right lung (HCC) (Primary)  Presenting for follow up of a pulmonary nodule in the RLL that is now s/p robotic assisted navigational bronchoscopy with pathology showing adenocarcinoma. No lymphadenopathy noted on imaging (CT and PET) or EBUS. The CT does not an 8 mm nodule adjacent to the primary nodule in question, and could represent a satellite nodule or a separate unrelated nodule. His tumor is either T1b or T3, with N0 and M0. His stage would either be 1A or 2B by version 9 of the TNM. Surgical resection is recommended for both, and I will be referring him to thoracic surgery as well as hem/onc for further workup and management. PFT's performed prior to today's visit were reviewed and noted to be within normal with no sign of obstructive lung disease or drop in DLCO.  - Ambulatory referral to Hematology / Oncology - Ambulatory referral to Cardiothoracic Surgery   Return if symptoms worsen or fail to improve.  I spent 30 minutes caring for this patient today, including preparing to see the patient, obtaining a medical history , reviewing a separately obtained history, performing a medically appropriate examination and/or evaluation, counseling and educating the patient/family/caregiver, ordering medications, tests, or procedures, documenting clinical information in the electronic health record, and independently interpreting results (not separately reported/billed) and communicating results to the patient/family/caregiver  Omar Glasgow, MD  Pulmonary Critical Care   End of visit medications:  No orders of the defined types were placed in this encounter.    Current Outpatient Medications:    aspirin  EC 81 MG tablet, Take 81 mg by mouth daily. Swallow whole., Disp: , Rfl:    Calcium Carbonate (CALCIUM 500 PO), Take 1 tablet by mouth daily., Disp: , Rfl:    chlorthalidone (HYGROTON) 25 MG tablet, Take 12.5 mg by mouth every  morning., Disp: , Rfl:    Cholecalciferol (VITAMIN D) 50 MCG (2000 UT) tablet, Take 2,000 Units by mouth daily., Disp: , Rfl:    levothyroxine (SYNTHROID) 150 MCG tablet, Take 150 mcg by mouth daily before breakfast., Disp: , Rfl:    Multiple Vitamin (MULTIVITAMIN) capsule, Take 1 capsule by mouth daily., Disp: , Rfl:    multivitamin-lutein (OCUVITE-LUTEIN) CAPS capsule, Take 1 capsule by mouth daily., Disp: , Rfl:    Omega-3 Fatty Acids (FISH OIL) 1000 MG CAPS, Take 1,000 mg by mouth daily., Disp: , Rfl:    rosuvastatin (CRESTOR) 40 MG tablet, Take 40 mg by mouth every evening., Disp: , Rfl:    sacubitril-valsartan (ENTRESTO ) 49-51 MG, Take 1 tablet by mouth 2 (two) times daily., Disp: 180 tablet, Rfl: 3   tadalafil (CIALIS) 5 MG tablet, Take 5 mg by mouth daily as needed for erectile dysfunction., Disp: , Rfl:    tamsulosin  (FLOMAX ) 0.4 MG CAPS capsule, Take 0.4 mg by mouth daily after breakfast., Disp: , Rfl:    Subjective:   PATIENT ID: Omar Hill GENDER: male DOB: 1950-07-15, MRN: [id number]  No chief complaint on file.   HPI  Patient is a pleasant 73 year old male presenting to clinic for follow up after lung nodule biopsy showed lung malignancy.  He recovered well after his biopsy procedure and only reports some soreness in his throat. Continues to feel well from a respiratory perspective, without any symptoms. He denies any cough, shortness of breath, chest pain, chest tightness, fevers, chills, night sweats, or weight loss.   He underwent a coronary CTA in January 2025 that showed a right  lower lobe pulmonary nodule with a mixed solid and subsolid component. This was further evaluated with a dedicated chest CT in April 2025.  The CT showed the spiculated part solid nodule in the right lower lobe with an adjacent satellite nodule concerning for bronchogenic carcinoma. PET/CT showed the nodule to be FDG avid.  He underwent robotic assisted navigational bronchoscopy with biopsy  showing adenocarcinoma. No enlarged lymph nodes were noted on EBUS.   Patient has been followed closely by his cardiologist and underwent LHC given coronary CT suggesting high risk for CAD. LHC 07/2023 showed severe diffuse coronary calcifications for which medical management was recommended.   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 He denies any occupational exposures and denies any inhalational exposures.  He denies any smoking or vape use.  Ancillary information including prior medications, full medical/surgical/family/social histories, and PFTs (when available) are listed below and have been reviewed.   Review of Systems  Constitutional:  Negative for chills, fever and weight loss.  Respiratory:  Negative for cough, hemoptysis, sputum production and shortness of breath.   Cardiovascular:  Negative for chest pain and palpitations.     Objective:  98% on RA BMI Readings from Last 3 Encounters:  09/19/23 25.01 kg/m  09/12/23 25.23 kg/m  08/31/23 25.50 kg/m   Wt Readings from Last 3 Encounters:  09/19/23 184 lb 6.4 oz (83.6 kg)  09/12/23 186 lb (84.4 kg)  08/31/23 188 lb (85.3 kg)    Physical Exam Constitutional:      Appearance: Normal appearance.  HENT:     Head: Normocephalic and atraumatic.  Cardiovascular:     Rate and Rhythm: Normal rate and regular rhythm.     Pulses: Normal pulses.     Heart sounds: Normal heart sounds.  Pulmonary:     Effort: Pulmonary effort is normal.     Breath sounds: Normal breath sounds.  Musculoskeletal:     Cervical back: Normal range of motion and neck supple.  Neurological:     General: No focal deficit present.     Mental Status: He is alert and oriented to person, place, and time. Mental status is at baseline.       Ancillary Information    Past Medical History:  Diagnosis Date   Aortic atherosclerosis (HCC)    BPH (benign prostatic hyperplasia)    Cervical spinal stenosis     Cervicalgia    CKD (chronic kidney disease), stage III (HCC)    Coronary artery disease    DDD (degenerative disc disease), lumbosacral    ED (erectile dysfunction)    a.) on PDE5i (tadalafil)   Graves disease    a.) s/p I-131 ablation   Hepatic cyst (multiple)    HFrEF (heart failure with reduced ejection fraction) (HCC)    HTN (hypertension)    Hyperlipidemia    LBBB (left bundle branch block)    Long-term use of aspirin  therapy    Myocardial infarction (HCC)    Nephrolithiasis    Osteopenia    Postoperative hypothyroidism    Prostatitis 07/2023   Right lower lobe pulmonary nodule 05/11/2023   a.) CT chest 05/11/2023: spiculated 1.4 cm RLL mass; b.) high resolution CT chest 08/09/2023: partially solid spiculated nodule anterior RLL measuring 1.7 x 2.5 cm with an internal 1.3 x 1.6 cm solid component; adjacent satellite ground-glass nodule along the posterior margin measuring 5 mm; c.) PET CT 09/05/2023: hypermetabolic RLL spiculated nodule with max SUV 3.7  Family History  Problem Relation Age of Onset   Lupus Mother    Cancer - Colon Father    Cancer - Prostate Father    Heart failure Sister    Sarcoidosis Sister      Past Surgical History:  Procedure Laterality Date   ACHILLES TENDON REPAIR Left    BRONCHOSCOPY, WITH BIOPSY USING ELECTROMAGNETIC NAVIGATION Bilateral 09/12/2023   Procedure: ROBOTIC ASSISTED NAVIGATIONAL BRONCHOSCOPY;  Surgeon: Omar Glasgow, MD;  Location: [facility] ORS;  Service: Pulmonary;  Laterality: Bilateral;   COLONOSCOPY     HAND TENDON SURGERY Left    thumb   LEFT HEART CATH AND CORONARY ANGIOGRAPHY N/A 07/31/2023   Procedure: LEFT HEART CATH AND CORONARY ANGIOGRAPHY;  Surgeon: Knox Perl, MD;  Location: [facility];  Service: Cardiovascular;  Laterality: N/A;   TONSILLECTOMY      Social History   Socioeconomic History   Marital status: Married    Spouse name: Not on file   Number of children: Not on file   Years of education: Not on  file   Highest education level: Not on file  Occupational History   Not on file  Tobacco Use   Smoking status: Never   Smokeless tobacco: Never  Vaping Use   Vaping status: Never Used  Substance and Sexual Activity   Alcohol use: Yes    Comment: RARELY   Drug use: Never   Sexual activity: Never  Other Topics Concern   Not on file  Social History Narrative   Not on file   Social Drivers of Health   Financial Resource Strain: Not on file  Food Insecurity: Not on file  Transportation Needs: Not on file  Physical Activity: Not on file  Stress: Not on file  Social Connections: Not on file  Intimate Partner Violence: Not on file     Allergies  Allergen Reactions   Pollen Extract Cough   Benadryl [Diphenhydramine] Other (See Comments)    Could not urinate-caused prostate issues   Mucinex Dm [Dm-Guaifenesin Er]     Could not urinate-caused prostate issues     CBC    Component Value Date/Time   WBC 7.2 07/25/2023 1327   WBC 8.6 03/07/2022 2059   RBC 5.25 07/25/2023 1327   RBC 5.35 03/07/2022 2059   HGB 14.6 07/25/2023 1327   HCT 45.2 07/25/2023 1327   PLT 226 07/25/2023 1327   MCV 86 07/25/2023 1327   MCH 27.8 07/25/2023 1327   MCH 27.7 03/07/2022 2059   MCHC 32.3 07/25/2023 1327   MCHC 33.3 03/07/2022 2059   RDW 13.6 07/25/2023 1327    Pulmonary Functions Testing Results:    Latest Ref Rng & Units 09/19/2023    8:04 AM  PFT Results  FVC-Pre L 4.28  P  FVC-Predicted Pre % 91  P  FVC-Post L 4.33  P  FVC-Predicted Post % 92  P  Pre FEV1/FVC % % 81  P  Post FEV1/FCV % % 87  P  FEV1-Pre L 3.48  P  FEV1-Predicted Pre % 101  P  FEV1-Post L 3.75  P  DLCO uncorrected ml/min/mmHg 25.57  P  DLCO UNC% % 94  P  DLVA Predicted % 90  P  TLC L 8.16  P  TLC % Predicted % 109  P  RV % Predicted % 129  P    P Preliminary result    Outpatient Medications Prior to Visit  Medication Sig Dispense Refill   aspirin  EC 81 MG tablet  Take 81 mg by mouth daily. Swallow  whole.     Calcium Carbonate (CALCIUM 500 PO) Take 1 tablet by mouth daily.     chlorthalidone (HYGROTON) 25 MG tablet Take 12.5 mg by mouth every morning.     Cholecalciferol (VITAMIN D) 50 MCG (2000 UT) tablet Take 2,000 Units by mouth daily.     levothyroxine (SYNTHROID) 150 MCG tablet Take 150 mcg by mouth daily before breakfast.     Multiple Vitamin (MULTIVITAMIN) capsule Take 1 capsule by mouth daily.     multivitamin-lutein (OCUVITE-LUTEIN) CAPS capsule Take 1 capsule by mouth daily.     Omega-3 Fatty Acids (FISH OIL) 1000 MG CAPS Take 1,000 mg by mouth daily.     rosuvastatin (CRESTOR) 40 MG tablet Take 40 mg by mouth every evening.     sacubitril-valsartan (ENTRESTO ) 49-51 MG Take 1 tablet by mouth 2 (two) times daily. 180 tablet 3   tadalafil (CIALIS) 5 MG tablet Take 5 mg by mouth daily as needed for erectile dysfunction.     tamsulosin  (FLOMAX ) 0.4 MG CAPS capsule Take 0.4 mg by mouth daily after breakfast.     No facility-administered medications prior to visit.

## 2023-09-19 NOTE — Patient Instructions (Signed)
 Full PFT performed today.

## 2023-09-20 ENCOUNTER — Ambulatory Visit: Payer: Self-pay | Admitting: Student in an Organized Health Care Education/Training Program

## 2023-09-20 ENCOUNTER — Other Ambulatory Visit: Payer: Self-pay | Admitting: Medical Oncology

## 2023-09-20 ENCOUNTER — Ambulatory Visit: Payer: Self-pay | Admitting: Cardiovascular Disease

## 2023-09-20 DIAGNOSIS — K635 Polyp of colon: Secondary | ICD-10-CM | POA: Diagnosis not present

## 2023-09-20 DIAGNOSIS — Z1211 Encounter for screening for malignant neoplasm of colon: Secondary | ICD-10-CM | POA: Diagnosis not present

## 2023-09-20 DIAGNOSIS — K648 Other hemorrhoids: Secondary | ICD-10-CM | POA: Diagnosis not present

## 2023-09-20 DIAGNOSIS — D122 Benign neoplasm of ascending colon: Secondary | ICD-10-CM | POA: Diagnosis not present

## 2023-09-20 DIAGNOSIS — Z800 Family history of malignant neoplasm of digestive organs: Secondary | ICD-10-CM | POA: Diagnosis not present

## 2023-09-20 DIAGNOSIS — Z860101 Personal history of adenomatous and serrated colon polyps: Secondary | ICD-10-CM | POA: Diagnosis not present

## 2023-09-21 ENCOUNTER — Inpatient Hospital Stay

## 2023-09-21 ENCOUNTER — Inpatient Hospital Stay: Attending: Internal Medicine | Admitting: Internal Medicine

## 2023-09-21 ENCOUNTER — Other Ambulatory Visit: Payer: Self-pay | Admitting: *Deleted

## 2023-09-21 VITALS — BP 115/60 | HR 70 | Temp 98.3°F | Resp 17 | Ht 72.0 in | Wt 186.5 lb

## 2023-09-21 DIAGNOSIS — E785 Hyperlipidemia, unspecified: Secondary | ICD-10-CM | POA: Diagnosis not present

## 2023-09-21 DIAGNOSIS — Z87442 Personal history of urinary calculi: Secondary | ICD-10-CM | POA: Diagnosis not present

## 2023-09-21 DIAGNOSIS — Z800 Family history of malignant neoplasm of digestive organs: Secondary | ICD-10-CM | POA: Diagnosis not present

## 2023-09-21 DIAGNOSIS — M8580 Other specified disorders of bone density and structure, unspecified site: Secondary | ICD-10-CM | POA: Insufficient documentation

## 2023-09-21 DIAGNOSIS — I130 Hypertensive heart and chronic kidney disease with heart failure and stage 1 through stage 4 chronic kidney disease, or unspecified chronic kidney disease: Secondary | ICD-10-CM | POA: Insufficient documentation

## 2023-09-21 DIAGNOSIS — M4802 Spinal stenosis, cervical region: Secondary | ICD-10-CM | POA: Insufficient documentation

## 2023-09-21 DIAGNOSIS — I129 Hypertensive chronic kidney disease with stage 1 through stage 4 chronic kidney disease, or unspecified chronic kidney disease: Secondary | ICD-10-CM | POA: Diagnosis not present

## 2023-09-21 DIAGNOSIS — N183 Chronic kidney disease, stage 3 unspecified: Secondary | ICD-10-CM | POA: Diagnosis not present

## 2023-09-21 DIAGNOSIS — I252 Old myocardial infarction: Secondary | ICD-10-CM | POA: Insufficient documentation

## 2023-09-21 DIAGNOSIS — M4316 Spondylolisthesis, lumbar region: Secondary | ICD-10-CM | POA: Diagnosis not present

## 2023-09-21 DIAGNOSIS — M51379 Other intervertebral disc degeneration, lumbosacral region without mention of lumbar back pain or lower extremity pain: Secondary | ICD-10-CM | POA: Insufficient documentation

## 2023-09-21 DIAGNOSIS — E039 Hypothyroidism, unspecified: Secondary | ICD-10-CM | POA: Insufficient documentation

## 2023-09-21 DIAGNOSIS — C3431 Malignant neoplasm of lower lobe, right bronchus or lung: Secondary | ICD-10-CM | POA: Insufficient documentation

## 2023-09-21 DIAGNOSIS — Z8042 Family history of malignant neoplasm of prostate: Secondary | ICD-10-CM | POA: Insufficient documentation

## 2023-09-21 DIAGNOSIS — M4317 Spondylolisthesis, lumbosacral region: Secondary | ICD-10-CM | POA: Insufficient documentation

## 2023-09-21 DIAGNOSIS — I2510 Atherosclerotic heart disease of native coronary artery without angina pectoris: Secondary | ICD-10-CM | POA: Insufficient documentation

## 2023-09-21 DIAGNOSIS — R911 Solitary pulmonary nodule: Secondary | ICD-10-CM

## 2023-09-21 DIAGNOSIS — E7800 Pure hypercholesterolemia, unspecified: Secondary | ICD-10-CM | POA: Diagnosis not present

## 2023-09-21 DIAGNOSIS — E1122 Type 2 diabetes mellitus with diabetic chronic kidney disease: Secondary | ICD-10-CM | POA: Insufficient documentation

## 2023-09-21 DIAGNOSIS — Z79899 Other long term (current) drug therapy: Secondary | ICD-10-CM | POA: Diagnosis not present

## 2023-09-21 DIAGNOSIS — Z79890 Hormone replacement therapy (postmenopausal): Secondary | ICD-10-CM | POA: Diagnosis not present

## 2023-09-21 DIAGNOSIS — N400 Enlarged prostate without lower urinary tract symptoms: Secondary | ICD-10-CM | POA: Diagnosis not present

## 2023-09-21 DIAGNOSIS — C3490 Malignant neoplasm of unspecified part of unspecified bronchus or lung: Secondary | ICD-10-CM | POA: Diagnosis not present

## 2023-09-21 LAB — CBC WITH DIFFERENTIAL (CANCER CENTER ONLY)
Abs Immature Granulocytes: 0.03 10*3/uL (ref 0.00–0.07)
Basophils Absolute: 0.1 10*3/uL (ref 0.0–0.1)
Basophils Relative: 1 %
Eosinophils Absolute: 0.1 10*3/uL (ref 0.0–0.5)
Eosinophils Relative: 1 %
HCT: 38.1 % — ABNORMAL LOW (ref 39.0–52.0)
Hemoglobin: 13 g/dL (ref 13.0–17.0)
Immature Granulocytes: 0 %
Lymphocytes Relative: 21 %
Lymphs Abs: 2 10*3/uL (ref 0.7–4.0)
MCH: 27.7 pg (ref 26.0–34.0)
MCHC: 34.1 g/dL (ref 30.0–36.0)
MCV: 81.2 fL (ref 80.0–100.0)
Monocytes Absolute: 0.5 10*3/uL (ref 0.1–1.0)
Monocytes Relative: 5 %
Neutro Abs: 6.7 10*3/uL (ref 1.7–7.7)
Neutrophils Relative %: 72 %
Platelet Count: 212 10*3/uL (ref 150–400)
RBC: 4.69 MIL/uL (ref 4.22–5.81)
RDW: 15.1 % (ref 11.5–15.5)
WBC Count: 9.4 10*3/uL (ref 4.0–10.5)
nRBC: 0 % (ref 0.0–0.2)

## 2023-09-21 LAB — CMP (CANCER CENTER ONLY)
ALT: 16 U/L (ref 0–44)
AST: 24 U/L (ref 15–41)
Albumin: 4.1 g/dL (ref 3.5–5.0)
Alkaline Phosphatase: 50 U/L (ref 38–126)
Anion gap: 4 — ABNORMAL LOW (ref 5–15)
BUN: 14 mg/dL (ref 8–23)
CO2: 31 mmol/L (ref 22–32)
Calcium: 9.4 mg/dL (ref 8.9–10.3)
Chloride: 104 mmol/L (ref 98–111)
Creatinine: 1.27 mg/dL — ABNORMAL HIGH (ref 0.61–1.24)
GFR, Estimated: >60 mL/min (ref >60)
Glucose, Bld: 103 mg/dL — ABNORMAL HIGH (ref 70–99)
Potassium: 3.8 mmol/L (ref 3.5–5.1)
Sodium: 139 mmol/L (ref 135–145)
Total Bilirubin: 0.5 mg/dL (ref 0.0–1.2)
Total Protein: 7.4 g/dL (ref 6.5–8.1)

## 2023-09-21 NOTE — Progress Notes (Signed)
 [facility] [phone]) [phone]   [phone]  CONSULT NOTE  REFERRING PHYSICIAN: Dr. Berneta Brightly Degayli  REASON FOR CONSULTATION:  73 years old African-American male recently diagnosed with lung cancer.  HPI Omar Hill is a 73 y.o. male came to the clinic today for initial evaluation of recently diagnosed lung cancer accompanied by his wife.Discussed the use of AI scribe software for clinical note transcription with the patient, who gave verbal consent to proceed.  History of Present Illness   Omar Hill is a 73 year old male with adenocarcinoma of the lung who presents for initial oncology consultation. He is accompanied by his wife, Omar Hill. He was referred by Dr. Vonzella Guernsey following a diagnosis of lung cancer.  He was initially undergoing a cardiac workup which included a CT scan for cardiac scoring on May 11, 2023. This scan incidentally revealed a 1.4 cm nodule in the right lower lobe of his lung. Subsequent imaging on August 09, 2023, showed the nodule had increased in size to 1.7 by 2.5 cm. A PET scan on September 05, 2023, indicated mild activity in the nodule with no other areas of concern. A bronchoscopy was performed, and he recalls being told he had cancer, but does not remember the specific name of the cancer.  He feels 'normal' today with no complaints such as chest pain, breathing issues, nausea, vomiting, diarrhea, or headaches. He has not had a brain scan but underwent a breathing test earlier this week.  His past medical history includes atherosclerosis, enlarged prostate, cervical spinal stenosis, chronic kidney disease, chronic heart disease, degenerative disc disease, high blood pressure, and high cholesterol. He has a history of a heart attack indicated by stress tests but denies any history of kidney stones or diabetes.  In terms of family history, his father had colon cancer, skin cancer, prostate cancer, and myeloma, while his mother had  lupus. His sister has sarcoidosis.  Socially, he is a retired Emergency planning/management officer, has never smoked, drinks alcohol very occasionally, and has no history of drug use. He is allergic to DM- Guaifenesin and the active ingredient in Benadryl, which affects his prostate.      HPI  Past Medical History:  Diagnosis Date   Aortic atherosclerosis (HCC)    BPH (benign prostatic hyperplasia)    Cervical spinal stenosis    Cervicalgia    CKD (chronic kidney disease), stage III (HCC)    Coronary artery disease    DDD (degenerative disc disease), lumbosacral    ED (erectile dysfunction)    a.) on PDE5i (tadalafil)   Graves disease    a.) s/p I-131 ablation   Hepatic cyst (multiple)    HFrEF (heart failure with reduced ejection fraction) (HCC)    HTN (hypertension)    Hyperlipidemia    LBBB (left bundle branch block)    Long-term use of aspirin  therapy    Myocardial infarction (HCC)    Nephrolithiasis    Osteopenia    Postoperative hypothyroidism    Prostatitis 07/2023   Right lower lobe pulmonary nodule 05/11/2023   a.) CT chest 05/11/2023: spiculated 1.4 cm RLL mass; b.) high resolution CT chest 08/09/2023: partially solid spiculated nodule anterior RLL measuring 1.7 x 2.5 cm with an internal 1.3 x 1.6 cm solid component; adjacent satellite ground-glass nodule along the posterior margin measuring 5 mm; c.) PET CT 09/05/2023: hypermetabolic RLL spiculated nodule with max SUV 3.7    Past Surgical History:  Procedure Laterality Date   ACHILLES TENDON  REPAIR Left    BRONCHOSCOPY, WITH BIOPSY USING ELECTROMAGNETIC NAVIGATION Bilateral 09/12/2023   Procedure: ROBOTIC ASSISTED NAVIGATIONAL BRONCHOSCOPY;  Surgeon: Vergia Glasgow, MD;  Location: [facility] ORS;  Service: Pulmonary;  Laterality: Bilateral;   COLONOSCOPY     HAND TENDON SURGERY Left    thumb   LEFT HEART CATH AND CORONARY ANGIOGRAPHY N/A 07/31/2023   Procedure: LEFT HEART CATH AND CORONARY ANGIOGRAPHY;  Surgeon: Knox Perl, MD;  Location: [facility]  INVASIVE CV LAB;  Service: Cardiovascular;  Laterality: N/A;   TONSILLECTOMY      Family History  Problem Relation Age of Onset   Lupus Mother    Cancer - Colon Father    Cancer - Prostate Father    Heart failure Sister    Sarcoidosis Sister     Social History Social History   Tobacco Use   Smoking status: Never   Smokeless tobacco: Never  Vaping Use   Vaping status: Never Used  Substance Use Topics   Alcohol use: Yes    Comment: RARELY   Drug use: Never    Allergies  Allergen Reactions   Pollen Extract Cough   Benadryl [Diphenhydramine] Other (See Comments)    Could not urinate-caused prostate issues   Mucinex Dm [Dm-Guaifenesin Er]     Could not urinate-caused prostate issues    Current Outpatient Medications  Medication Sig Dispense Refill   aspirin  EC 81 MG tablet Take 81 mg by mouth daily. Swallow whole.     Calcium Carbonate (CALCIUM 500 PO) Take 1 tablet by mouth daily.     chlorthalidone (HYGROTON) 25 MG tablet Take 12.5 mg by mouth every morning.     Cholecalciferol (VITAMIN D) 50 MCG (2000 UT) tablet Take 2,000 Units by mouth daily.     GAVILYTE-G 236 g solution as directed.     levothyroxine (SYNTHROID) 150 MCG tablet Take 150 mcg by mouth daily before breakfast.     Multiple Vitamin (MULTIVITAMIN) capsule Take 1 capsule by mouth daily.     multivitamin-lutein (OCUVITE-LUTEIN) CAPS capsule Take 1 capsule by mouth daily.     Omega-3 Fatty Acids (FISH OIL) 1000 MG CAPS Take 1,000 mg by mouth daily.     rosuvastatin (CRESTOR) 40 MG tablet Take 40 mg by mouth every evening.     sacubitril-valsartan (ENTRESTO ) 49-51 MG Take 1 tablet by mouth 2 (two) times daily. 180 tablet 3   tadalafil (CIALIS) 5 MG tablet Take 5 mg by mouth daily as needed for erectile dysfunction.     tamsulosin  (FLOMAX ) 0.4 MG CAPS capsule Take 0.4 mg by mouth daily after breakfast.     No current facility-administered medications for this visit.    Review of Systems  Constitutional:  negative Eyes: negative Ears, nose, mouth, throat, and face: negative Respiratory: negative Cardiovascular: negative Gastrointestinal: negative Genitourinary:negative Integument/breast: negative Hematologic/lymphatic: negative Musculoskeletal:negative Neurological: negative Behavioral/Psych: negative Endocrine: negative Allergic/Immunologic: negative  Physical Exam  XLK:GMWNU, healthy, no distress, well nourished, and well developed SKIN: skin color, texture, turgor are normal, no rashes or significant lesions HEAD: Normocephalic, No masses, lesions, tenderness or abnormalities EYES: normal, PERRLA, Conjunctiva are pink and non-injected EARS: External ears normal, Canals clear OROPHARYNX:no exudate, no erythema, and lips, buccal mucosa, and tongue normal  NECK: supple, no adenopathy, no JVD LYMPH:  no palpable lymphadenopathy, no hepatosplenomegaly LUNGS: clear to auscultation , and palpation HEART: regular rate & rhythm, no murmurs, and no gallops ABDOMEN:abdomen soft, non-tender, normal bowel sounds, and no masses or organomegaly BACK: Back symmetric, no curvature., No  CVA tenderness EXTREMITIES:no joint deformities, effusion, or inflammation, no edema  NEURO: alert & oriented x 3 with fluent speech, no focal motor/sensory deficits  PERFORMANCE STATUS: ECOG 0  LABORATORY DATA: Lab Results  Component Value Date   WBC 9.4 09/21/2023   HGB 13.0 09/21/2023   HCT 38.1 (L) 09/21/2023   MCV 81.2 09/21/2023   PLT 212 09/21/2023      Chemistry      Component Value Date/Time   NA 138 09/18/2023 1309   K 4.0 09/18/2023 1309   CL 101 09/18/2023 1309   CO2 24 09/18/2023 1309   BUN 6 (L) 09/18/2023 1309   CREATININE 1.23 09/18/2023 1309      Component Value Date/Time   CALCIUM 9.0 09/18/2023 1309   ALKPHOS 49 03/07/2022 2059   AST 28 03/07/2022 2059   ALT 18 03/07/2022 2059   BILITOT 0.5 03/07/2022 2059       RADIOGRAPHIC STUDIES: DG Chest Port 1 View Result  Date: 09/12/2023 CLINICAL DATA:  Status post bronchoscopy for right lower lobe nodule EXAM: PORTABLE CHEST 1 VIEW COMPARISON:  CT chest dated 09/05/2023 FINDINGS: Normal lung volumes. Nodular right basilar focus in keeping with known nodule. No pleural effusion or pneumothorax. The heart size and mediastinal contours are within normal limits. No acute osseous abnormality. IMPRESSION: 1. No pneumothorax. 2. Nodular right basilar focus in keeping with known nodule. Electronically Signed   By: Limin  Xu M.D.   On: 09/12/2023 13:45   DG C-Arm 1-60 Min-No Report Result Date: 09/12/2023 Fluoroscopy was utilized by the requesting physician.  No radiographic interpretation.   NM PET Image Initial (PI) Skull Base To Thigh (F-18 FDG) Result Date: 09/06/2023 CLINICAL DATA:  Initial treatment strategy for lung nodule. EXAM: NUCLEAR MEDICINE PET SKULL BASE TO THIGH TECHNIQUE: 9.37 mCi F-18 FDG was injected intravenously. Full-ring PET imaging was performed from the skull base to thigh after the radiotracer. CT data was obtained and used for attenuation correction and anatomic localization. Fasting blood glucose: 79 mg/dl Multidetector CT imaging of the chest was performed following the standard protocol without IV contrast. Multiplanar and three-dimensional reformatted images were generated from original data set. RADIATION DOSE REDUCTION: This exam was performed according to the departmental dose-optimization program which includes automated exposure control, adjustment of the mA and/or kV according to patient size and/or use of iterative reconstruction technique. COMPARISON:  CT 08/09/2023. FINDINGS: Mediastinal blood pool activity: SUV max 2.5 Liver activity: SUV max 2.9 NECK: No specific abnormal radiotracer uptake in the neck including along lymph node change of the submandibular, posterior triangle or internal jugular region. Near symmetric uptake along the visualized intracranial compartment. Incidental CT findings:  Visualized paranasal sinuses and mastoid air cells are clear. Parotid glands, submandibular glands are unremarkable. Small thyroid  gland. Mild vascular calcifications. CHEST: No specific abnormal radiotracer uptake above mediastinal blood pool in the axillary regions, hilum or mediastinum. However there is some uptake identified along the spiculated lesion in the anterior right lower lobe. Maximum SUV value of 3.7. No additional areas of abnormal uptake along the lung parenchyma. Incidental CT findings: Heart is nonenlarged. Trace pericardial effusion. Coronary calcifications are seen. Thoracic aorta is normal course and caliber with some mild calcified plaque. Normal caliber thoracic esophagus. Small thyroid  gland. On this non IV contrast exam no specific abnormal lymph node enlargement seen in the axillary region, hilum or mediastinum. There is some linear opacity lung bases likely scar or atelectasis. No consolidation, pneumothorax or effusion. The spiculated nodule seen in the  anterior right lower lobe has some marginal ground-glass. This abuts the major fissure with some retraction. The central component of the nodule which is the most solid or dense component again measures proximally 17 x 16 mm on series 5, image 112. Small satellite semi-solid nodule posterolateral measures 7 mm at the same image. ABDOMEN/PELVIS: There is physiologic distribution radiotracer along the parenchymal organs, bowel and renal collecting systems. Very large prostate. There is some patchy uptake along the peripheral zone of the prostate posteriorly with maximum SUV approaching 50. Mild stranding. Please correlate with symptoms. Incidental CT findings: Left hepatic lobe benign-appearing cystic foci identified. Adrenal glands are preserved. Gallbladder is nondilated. Spleen is nonenlarged. Grossly preserved pancreatic parenchyma. Motion artifact. Small nonobstructing lower pole left-sided renal stones. No right renal stones. No  ureteral stones identified at this time. No bowel obstruction. Scattered colonic stool. Normal appendix. SKELETON: No specific abnormal uptake along the visualized skeleton. Incidental CT findings: Scattered degenerative changes along the spine and pelvis. Trace anterolisthesis of L4-5 and L5-S1. IMPRESSION: Spiculated nodule along the right lower lobe is again seen and has mild abnormal radiotracer uptake of maximum SUV of 3.7. Malignant lesion is possible. Recommend further workup. No additional areas of abnormal uptake to suggest metastatic disease. There is a very large prostate with some significant uptake posteriorly along the prostate with some adjacent stranding. Please correlate for any known history including the patient's PSA. Further workup such as prostate MRI as clinically appropriate Electronically Signed   By: Adrianna Horde M.D.   On: 09/06/2023 13:59   CT SUPER D CHEST WO CONTRAST Result Date: 09/06/2023 CLINICAL DATA:  Initial treatment strategy for lung nodule. EXAM: NUCLEAR MEDICINE PET SKULL BASE TO THIGH TECHNIQUE: 9.37 mCi F-18 FDG was injected intravenously. Full-ring PET imaging was performed from the skull base to thigh after the radiotracer. CT data was obtained and used for attenuation correction and anatomic localization. Fasting blood glucose: 79 mg/dl Multidetector CT imaging of the chest was performed following the standard protocol without IV contrast. Multiplanar and three-dimensional reformatted images were generated from original data set. RADIATION DOSE REDUCTION: This exam was performed according to the departmental dose-optimization program which includes automated exposure control, adjustment of the mA and/or kV according to patient size and/or use of iterative reconstruction technique. COMPARISON:  CT 08/09/2023. FINDINGS: Mediastinal blood pool activity: SUV max 2.5 Liver activity: SUV max 2.9 NECK: No specific abnormal radiotracer uptake in the neck including along lymph  node change of the submandibular, posterior triangle or internal jugular region. Near symmetric uptake along the visualized intracranial compartment. Incidental CT findings: Visualized paranasal sinuses and mastoid air cells are clear. Parotid glands, submandibular glands are unremarkable. Small thyroid  gland. Mild vascular calcifications. CHEST: No specific abnormal radiotracer uptake above mediastinal blood pool in the axillary regions, hilum or mediastinum. However there is some uptake identified along the spiculated lesion in the anterior right lower lobe. Maximum SUV value of 3.7. No additional areas of abnormal uptake along the lung parenchyma. Incidental CT findings: Heart is nonenlarged. Trace pericardial effusion. Coronary calcifications are seen. Thoracic aorta is normal course and caliber with some mild calcified plaque. Normal caliber thoracic esophagus. Small thyroid  gland. On this non IV contrast exam no specific abnormal lymph node enlargement seen in the axillary region, hilum or mediastinum. There is some linear opacity lung bases likely scar or atelectasis. No consolidation, pneumothorax or effusion. The spiculated nodule seen in the anterior right lower lobe has some marginal ground-glass. This abuts the major fissure  with some retraction. The central component of the nodule which is the most solid or dense component again measures proximally 17 x 16 mm on series 5, image 112. Small satellite semi-solid nodule posterolateral measures 7 mm at the same image. ABDOMEN/PELVIS: There is physiologic distribution radiotracer along the parenchymal organs, bowel and renal collecting systems. Very large prostate. There is some patchy uptake along the peripheral zone of the prostate posteriorly with maximum SUV approaching 50. Mild stranding. Please correlate with symptoms. Incidental CT findings: Left hepatic lobe benign-appearing cystic foci identified. Adrenal glands are preserved. Gallbladder is  nondilated. Spleen is nonenlarged. Grossly preserved pancreatic parenchyma. Motion artifact. Small nonobstructing lower pole left-sided renal stones. No right renal stones. No ureteral stones identified at this time. No bowel obstruction. Scattered colonic stool. Normal appendix. SKELETON: No specific abnormal uptake along the visualized skeleton. Incidental CT findings: Scattered degenerative changes along the spine and pelvis. Trace anterolisthesis of L4-5 and L5-S1. IMPRESSION: Spiculated nodule along the right lower lobe is again seen and has mild abnormal radiotracer uptake of maximum SUV of 3.7. Malignant lesion is possible. Recommend further workup. No additional areas of abnormal uptake to suggest metastatic disease. There is a very large prostate with some significant uptake posteriorly along the prostate with some adjacent stranding. Please correlate for any known history including the patient's PSA. Further workup such as prostate MRI as clinically appropriate Electronically Signed   By: Adrianna Horde M.D.   On: 09/06/2023 13:59    ASSESSMENT: This is a very pleasant 73 years old never smoker African-American male with recently diagnosed stage Ia (T1c, N0, M0) non-small cell lung cancer, adenocarcinoma presented with right lower lobe lung nodule diagnosed in May 2025.   PLAN: I had a nice discussion with the patient and his wife today about his current disease stage, prognosis and treatment options. I personally independently reviewed the previous imaging studies and discussed the result and the pathology report with the patient and his wife. Assessment and Plan    Lung cancer, stage 1A Stage 1A (T1c, N0, M0) Non small cell lung Cancer, adenocarcinoma of the right lower lobe of the lung. The tumor measures 1.7 by 2.5 cm, presenting as a part solid anterior right lower lobe nodule. PET scan indicates mild activity in the nodule with no other areas of concern. He is a non-smoker, consistent with  adenocarcinoma. Pulmonary function tests completed. Surgery is the optimal treatment option, with potential for cure if the tumor is resected and there is no lymph node involvement. If lymph node involvement is detected, additional treatment options will be considered. Surgery is preferred over radiation due to his non-smoking status and likely good lung capacity. Chemotherapy, targeted therapy, or immunotherapy are not indicated at this stage. Final staging will be confirmed post-surgery. - Refer to thoracic surgeon Dr. Deloise Ferries for surgical consultation on 09/29/2023. - Plan for surgical resection of the right lower lobe and lymph node dissection. - Post-surgery, if no spread is found, monitor without additional treatment. - If lymph node involvement is found, discuss additional treatment options post-surgery. - Encourage continued physical activity to aid recovery post-surgery. - Order MRI brain to r/o brain metastases.  Chronic heart disease Chronic heart disease with no acute issues during this visit. Previous heart catheterization showed no need for stents.  History of myocardial infarction History of myocardial infarction with no acute issues during this visit.  Atherosclerosis Atherosclerosis with no acute issues during this visit.  Hypertension Hypertension with no acute issues during this visit.  Hyperlipidemia Hyperlipidemia with no acute issues during this visit.  Chronic kidney disease, unspecified Chronic kidney disease, unspecified. Nephrologist did not consider it significant at this time.  Benign prostatic hyperplasia Benign prostatic hyperplasia with no acute issues during this visit.  Cervical spinal stenosis Cervical spinal stenosis with no acute issues during this visit.  Degenerative disc disease Degenerative disc disease with no acute issues during this visit.  Follow-up Follow-up plans for post-surgery care and monitoring discussed. - Follow up with Dr.  Deloise Ferries for surgical consultation on 09/29/2023. - Post-surgery follow-up with hematology and medical oncology a few weeks after surgery to determine further treatment based on surgical findings. - Contact the office if not referred back post-surgery.   He was advised to call immediately if he has any other concerning symptoms in the interval. The patient voices understanding of current disease status and treatment options and is in agreement with the current care plan.  All questions were answered. The patient knows to call the clinic with any problems, questions or concerns. We can certainly see the patient much sooner if necessary.  Thank you so much for allowing me to participate in the care of Omar Hill. I will continue to follow up the patient with you and assist in his care. The total time spent in the appointment was 60 minutes including review of chart and various tests results, discussions about plan of care and coordination of care plan .   Disclaimer: This note was dictated with voice recognition software. Similar sounding words can inadvertently be transcribed and may not be corrected upon review.   Aurelio Blower Sep 21, 2023, 2:47 PM

## 2023-09-21 NOTE — Progress Notes (Signed)
 I met the Omar Hill today face to face at his consult with Dr. Marguerita Shih. Omar Hill was accompanied by his wife, Wallene Gum. Plan is for patient to see CT surgery. Omar Hill completed his PFTs on 5/13 and sees Dr.Lightfoot on 5/23. Omar Hill understands that if it determined not to be a surgical candidate, that he would be referred to radiation oncology. Omar Hill instructed to follow up with Dr. Marguerita Shih in 2-3 weeks after surgery or sooner if needed. I provided my card to the Omar Hill and encouraged him to call me with any questions.

## 2023-09-25 NOTE — Progress Notes (Signed)
 [address]       [place] [address]             [phone]                    Jedidiah Demartini [facility] Medical Record #[id number] Date of Birth: 1951-04-11  Referring: Vergia Glasgow, MD Primary Care: Glena Landau, MD Primary Cardiologist: None  Chief Complaint:    Chief Complaint  Patient presents with   Lung Cancer    New patient consult, ronch 5/6, PFTs 5/13, Chest CT/PET 4/29    History of Present Illness:    Omar Hill is a 73 y.o. 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  This was found incidentally while being worked up by his cardiology.  He does have some degree of heart failure, and 2V CAD, but has been asymptomatic.  He was recently seen by his cardiologist and cleared for surgery.  He is a lifelong non-smoker.    Past Medical History:  Diagnosis Date   Aortic atherosclerosis (HCC)    BPH (benign prostatic hyperplasia)    Cervical spinal stenosis    Cervicalgia    CKD (chronic kidney disease), stage III (HCC)    Coronary artery disease    DDD (degenerative disc disease), lumbosacral    ED (erectile dysfunction)    a.) on PDE5i (tadalafil)   Graves disease    a.) s/p I-131 ablation   Hepatic cyst (multiple)    HFrEF (heart failure with reduced ejection fraction) (HCC)    HTN (hypertension)    Hyperlipidemia    LBBB (left bundle branch block)    Long-term use of aspirin  therapy    Myocardial infarction (HCC)    Nephrolithiasis    Osteopenia    Postoperative hypothyroidism    Prostatitis 07/2023   Right lower lobe pulmonary nodule 05/11/2023   a.) CT chest 05/11/2023: spiculated 1.4 cm RLL mass; b.) high resolution CT chest 08/09/2023: partially solid spiculated nodule anterior RLL measuring 1.7 x 2.5 cm with an internal 1.3 x 1.6 cm solid component; adjacent satellite ground-glass nodule along the posterior margin measuring 5 mm; c.) PET CT 09/05/2023: hypermetabolic RLL spiculated nodule with max  SUV 3.7    Past Surgical History:  Procedure Laterality Date   ACHILLES TENDON REPAIR Left    BRONCHOSCOPY, WITH BIOPSY USING ELECTROMAGNETIC NAVIGATION Bilateral 09/12/2023   Procedure: ROBOTIC ASSISTED NAVIGATIONAL BRONCHOSCOPY;  Surgeon: Vergia Glasgow, MD;  Location: [facility] ORS;  Service: Pulmonary;  Laterality: Bilateral;   COLONOSCOPY     HAND TENDON SURGERY Left    thumb   LEFT HEART CATH AND CORONARY ANGIOGRAPHY N/A 07/31/2023   Procedure: LEFT HEART CATH AND CORONARY ANGIOGRAPHY;  Surgeon: Knox Perl, MD;  Location: [facility];  Service: Cardiovascular;  Laterality: N/A;   TONSILLECTOMY      Family History  Problem Relation Age of Onset   Lupus Mother    Cancer - Colon Father    Cancer - Prostate Father    Heart failure Sister    Sarcoidosis Sister      Social History   Tobacco Use  Smoking Status Never  Smokeless Tobacco Never    Social History   Substance and Sexual Activity  Alcohol Use Yes   Comment: RARELY     Allergies  Allergen Reactions   Pollen Extract Cough   Benadryl [Diphenhydramine] Other (See Comments)    Could not urinate-caused prostate  issues   Mucinex Dm [Dm-Guaifenesin Er]     Could not urinate-caused prostate issues    Current Outpatient Medications  Medication Sig Dispense Refill   aspirin  EC 81 MG tablet Take 81 mg by mouth daily. Swallow whole.     Calcium Carbonate (CALCIUM 500 PO) Take 1 tablet by mouth daily.     Cholecalciferol (VITAMIN D) 50 MCG (2000 UT) tablet Take 2,000 Units by mouth daily.     levothyroxine (SYNTHROID) 150 MCG tablet Take 150 mcg by mouth daily before breakfast.     Multiple Vitamin (MULTIVITAMIN) capsule Take 1 capsule by mouth daily.     multivitamin-lutein (OCUVITE-LUTEIN) CAPS capsule Take 1 capsule by mouth daily.     Omega-3 Fatty Acids (FISH OIL) 1000 MG CAPS Take 1,000 mg by mouth daily.     rosuvastatin (CRESTOR) 40 MG tablet Take 40 mg by mouth every evening.     sacubitril-valsartan  (ENTRESTO ) 49-51 MG Take 1 tablet by mouth 2 (two) times daily. 180 tablet 3   spironolactone (ALDACTONE) 25 MG tablet Take 0.5 tablets (12.5 mg total) by mouth daily. 15 tablet 11   tadalafil (CIALIS) 5 MG tablet Take 5 mg by mouth daily as needed for erectile dysfunction.     tamsulosin  (FLOMAX ) 0.4 MG CAPS capsule Take 0.4 mg by mouth daily after breakfast.     No current facility-administered medications for this visit.    Review of Systems  Constitutional:  Negative for malaise/fatigue.  Respiratory:  Negative for cough and shortness of breath.   Cardiovascular:  Negative for chest pain.  Neurological:  Negative for dizziness.     PHYSICAL EXAMINATION: BP (!) 156/84 (BP Location: Right Arm, Patient Position: Sitting, Cuff Size: Normal)   Pulse 78   Resp 20   Ht 6' (1.829 m)   Wt 184 lb (83.5 kg)   SpO2 97% Comment: RA  BMI 24.95 kg/m  Physical Exam Constitutional:      General: He is not in acute distress.    Appearance: He is not ill-appearing.  HENT:     Head: Normocephalic and atraumatic.  Eyes:     Extraocular Movements: Extraocular movements intact.  Cardiovascular:     Rate and Rhythm: Normal rate.  Pulmonary:     Effort: Pulmonary effort is normal. No respiratory distress.  Abdominal:     General: Abdomen is flat. There is no distension.  Musculoskeletal:        General: Normal range of motion.     Cervical back: Normal range of motion.  Skin:    General: Skin is warm and dry.  Neurological:     General: No focal deficit present.     Mental Status: He is alert and oriented to person, place, and time.          I have independently reviewed the above radiology studies  and reviewed the findings with the patient.   Recent Lab Findings: Lab Results  Component Value Date   WBC 9.4 09/21/2023   HGB 13.0 09/21/2023   HCT 38.1 (L) 09/21/2023   PLT 212 09/21/2023   GLUCOSE 103 (H) 09/21/2023   ALT 16 09/21/2023   AST 24 09/21/2023   NA 139  09/21/2023   K 3.8 09/21/2023   CL 104 09/21/2023   CREATININE 1.27 (H) 09/21/2023   BUN 14 09/21/2023   CO2 31 09/21/2023    Diagnostic Studies & Laboratory data:     Recent Radiology Findings:   [facility] [address]  Result Date: 09/12/2023 CLINICAL DATA:  Status post bronchoscopy for right lower lobe nodule EXAM: PORTABLE CHEST 1 VIEW COMPARISON:  CT chest dated 09/05/2023 FINDINGS: Normal lung volumes. Nodular right basilar focus in keeping with known nodule. No pleural effusion or pneumothorax. The heart size and mediastinal contours are within normal limits. No acute osseous abnormality. IMPRESSION: 1. No pneumothorax. 2. Nodular right basilar focus in keeping with known nodule. Electronically Signed   By: Limin  Xu M.D.   On: 09/12/2023 13:45   DG C-Arm 1-60 Min-No Report Result Date: 09/12/2023 Fluoroscopy was utilized by the requesting physician.  No radiographic interpretation.   NM PET Image Initial (PI) Skull Base To Thigh (F-18 FDG) Result Date: 09/06/2023 CLINICAL DATA:  Initial treatment strategy for lung nodule. EXAM: NUCLEAR MEDICINE PET SKULL BASE TO THIGH TECHNIQUE: 9.37 mCi F-18 FDG was injected intravenously. Full-ring PET imaging was performed from the skull base to thigh after the radiotracer. CT data was obtained and used for attenuation correction and anatomic localization. Fasting blood glucose: 79 mg/dl Multidetector CT imaging of the chest was performed following the standard protocol without IV contrast. Multiplanar and three-dimensional reformatted images were generated from original data set. RADIATION DOSE REDUCTION: This exam was performed according to the departmental dose-optimization program which includes automated exposure control, adjustment of the mA and/or kV according to patient size and/or use of iterative reconstruction technique. COMPARISON:  CT 08/09/2023. FINDINGS: Mediastinal blood pool activity: SUV max 2.5 Liver activity: SUV max 2.9 NECK: No specific  abnormal radiotracer uptake in the neck including along lymph node change of the submandibular, posterior triangle or internal jugular region. Near symmetric uptake along the visualized intracranial compartment. Incidental CT findings: Visualized paranasal sinuses and mastoid air cells are clear. Parotid glands, submandibular glands are unremarkable. Small thyroid  gland. Mild vascular calcifications. CHEST: No specific abnormal radiotracer uptake above mediastinal blood pool in the axillary regions, hilum or mediastinum. However there is some uptake identified along the spiculated lesion in the anterior right lower lobe. Maximum SUV value of 3.7. No additional areas of abnormal uptake along the lung parenchyma. Incidental CT findings: Heart is nonenlarged. Trace pericardial effusion. Coronary calcifications are seen. Thoracic aorta is normal course and caliber with some mild calcified plaque. Normal caliber thoracic esophagus. Small thyroid  gland. On this non IV contrast exam no specific abnormal lymph node enlargement seen in the axillary region, hilum or mediastinum. There is some linear opacity lung bases likely scar or atelectasis. No consolidation, pneumothorax or effusion. The spiculated nodule seen in the anterior right lower lobe has some marginal ground-glass. This abuts the major fissure with some retraction. The central component of the nodule which is the most solid or dense component again measures proximally 17 x 16 mm on series 5, image 112. Small satellite semi-solid nodule posterolateral measures 7 mm at the same image. ABDOMEN/PELVIS: There is physiologic distribution radiotracer along the parenchymal organs, bowel and renal collecting systems. Very large prostate. There is some patchy uptake along the peripheral zone of the prostate posteriorly with maximum SUV approaching 50. Mild stranding. Please correlate with symptoms. Incidental CT findings: Left hepatic lobe benign-appearing cystic foci  identified. Adrenal glands are preserved. Gallbladder is nondilated. Spleen is nonenlarged. Grossly preserved pancreatic parenchyma. Motion artifact. Small nonobstructing lower pole left-sided renal stones. No right renal stones. No ureteral stones identified at this time. No bowel obstruction. Scattered colonic stool. Normal appendix. SKELETON: No specific abnormal uptake along the visualized skeleton. Incidental CT findings: Scattered degenerative changes along the spine  and pelvis. Trace anterolisthesis of L4-5 and L5-S1. IMPRESSION: Spiculated nodule along the right lower lobe is again seen and has mild abnormal radiotracer uptake of maximum SUV of 3.7. Malignant lesion is possible. Recommend further workup. No additional areas of abnormal uptake to suggest metastatic disease. There is a very large prostate with some significant uptake posteriorly along the prostate with some adjacent stranding. Please correlate for any known history including the patient's PSA. Further workup such as prostate MRI as clinically appropriate Electronically Signed   By: Adrianna Horde M.D.   On: 09/06/2023 13:59   CT SUPER D CHEST WO CONTRAST Result Date: 09/06/2023 CLINICAL DATA:  Initial treatment strategy for lung nodule. EXAM: NUCLEAR MEDICINE PET SKULL BASE TO THIGH TECHNIQUE: 9.37 mCi F-18 FDG was injected intravenously. Full-ring PET imaging was performed from the skull base to thigh after the radiotracer. CT data was obtained and used for attenuation correction and anatomic localization. Fasting blood glucose: 79 mg/dl Multidetector CT imaging of the chest was performed following the standard protocol without IV contrast. Multiplanar and three-dimensional reformatted images were generated from original data set. RADIATION DOSE REDUCTION: This exam was performed according to the departmental dose-optimization program which includes automated exposure control, adjustment of the mA and/or kV according to patient size and/or  use of iterative reconstruction technique. COMPARISON:  CT 08/09/2023. FINDINGS: Mediastinal blood pool activity: SUV max 2.5 Liver activity: SUV max 2.9 NECK: No specific abnormal radiotracer uptake in the neck including along lymph node change of the submandibular, posterior triangle or internal jugular region. Near symmetric uptake along the visualized intracranial compartment. Incidental CT findings: Visualized paranasal sinuses and mastoid air cells are clear. Parotid glands, submandibular glands are unremarkable. Small thyroid  gland. Mild vascular calcifications. CHEST: No specific abnormal radiotracer uptake above mediastinal blood pool in the axillary regions, hilum or mediastinum. However there is some uptake identified along the spiculated lesion in the anterior right lower lobe. Maximum SUV value of 3.7. No additional areas of abnormal uptake along the lung parenchyma. Incidental CT findings: Heart is nonenlarged. Trace pericardial effusion. Coronary calcifications are seen. Thoracic aorta is normal course and caliber with some mild calcified plaque. Normal caliber thoracic esophagus. Small thyroid  gland. On this non IV contrast exam no specific abnormal lymph node enlargement seen in the axillary region, hilum or mediastinum. There is some linear opacity lung bases likely scar or atelectasis. No consolidation, pneumothorax or effusion. The spiculated nodule seen in the anterior right lower lobe has some marginal ground-glass. This abuts the major fissure with some retraction. The central component of the nodule which is the most solid or dense component again measures proximally 17 x 16 mm on series 5, image 112. Small satellite semi-solid nodule posterolateral measures 7 mm at the same image. ABDOMEN/PELVIS: There is physiologic distribution radiotracer along the parenchymal organs, bowel and renal collecting systems. Very large prostate. There is some patchy uptake along the peripheral zone of the  prostate posteriorly with maximum SUV approaching 50. Mild stranding. Please correlate with symptoms. Incidental CT findings: Left hepatic lobe benign-appearing cystic foci identified. Adrenal glands are preserved. Gallbladder is nondilated. Spleen is nonenlarged. Grossly preserved pancreatic parenchyma. Motion artifact. Small nonobstructing lower pole left-sided renal stones. No right renal stones. No ureteral stones identified at this time. No bowel obstruction. Scattered colonic stool. Normal appendix. SKELETON: No specific abnormal uptake along the visualized skeleton. Incidental CT findings: Scattered degenerative changes along the spine and pelvis. Trace anterolisthesis of L4-5 and L5-S1. IMPRESSION: Spiculated nodule along the  right lower lobe is again seen and has mild abnormal radiotracer uptake of maximum SUV of 3.7. Malignant lesion is possible. Recommend further workup. No additional areas of abnormal uptake to suggest metastatic disease. There is a very large prostate with some significant uptake posteriorly along the prostate with some adjacent stranding. Please correlate for any known history including the patient's PSA. Further workup such as prostate MRI as clinically appropriate Electronically Signed   By: Adrianna Horde M.D.   On: 09/06/2023 13:59     PFTs:  - FVC: 91% - FEV1: 101% -DLCO: 94%    Pathology:  FINAL DIAGNOSIS        1. Lung, biopsy, RLL nodule :       -  ADENOCARCINOMA.       NOTE: DR. PATRICK HAS PEER-REVIEWED THE CASE AND AGREES WITH THE DIAGNOSIS.  A       SECURE CHAT MESSAGE WAS SENT TO DR. DGAYLI ON 09/14/23.   Assessment / Plan:   73 y.o. male with 1.7cm Right lower lobe NSCLC.  His pulmonary functions are acceptable.  He underwent a stress test in February of this year which was intermediate risk.  He subsequently underwent a LHC which showed LAD, and OM disease, but given that he was asymptomatic, medical therapy was recommended. He has been cleared for  surgery.  The risks and benefits of right RATS, RLLectomy were discussed in detail.  The patient is is agreeable to proceed.     I  spent 40 minutes with  the patient face to face in counseling and coordination of care.    Hilarie Lovely 09/29/2023 11:08 AM

## 2023-09-25 NOTE — H&P (View-Only) (Signed)
 [address]       [place] [address]             [phone]                    Jedidiah Demartini [facility] Medical Record #[id number] Date of Birth: 1951-04-11  Referring: Vergia Glasgow, MD Primary Care: Glena Landau, MD Primary Cardiologist: None  Chief Complaint:    Chief Complaint  Patient presents with   Lung Cancer    New patient consult, ronch 5/6, PFTs 5/13, Chest CT/PET 4/29    History of Present Illness:    Strider Vallance Ruhe is a 73 y.o. 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  This was found incidentally while being worked up by his cardiology.  He does have some degree of heart failure, and 2V CAD, but has been asymptomatic.  He was recently seen by his cardiologist and cleared for surgery.  He is a lifelong non-smoker.    Past Medical History:  Diagnosis Date   Aortic atherosclerosis (HCC)    BPH (benign prostatic hyperplasia)    Cervical spinal stenosis    Cervicalgia    CKD (chronic kidney disease), stage III (HCC)    Coronary artery disease    DDD (degenerative disc disease), lumbosacral    ED (erectile dysfunction)    a.) on PDE5i (tadalafil)   Graves disease    a.) s/p I-131 ablation   Hepatic cyst (multiple)    HFrEF (heart failure with reduced ejection fraction) (HCC)    HTN (hypertension)    Hyperlipidemia    LBBB (left bundle branch block)    Long-term use of aspirin  therapy    Myocardial infarction (HCC)    Nephrolithiasis    Osteopenia    Postoperative hypothyroidism    Prostatitis 07/2023   Right lower lobe pulmonary nodule 05/11/2023   a.) CT chest 05/11/2023: spiculated 1.4 cm RLL mass; b.) high resolution CT chest 08/09/2023: partially solid spiculated nodule anterior RLL measuring 1.7 x 2.5 cm with an internal 1.3 x 1.6 cm solid component; adjacent satellite ground-glass nodule along the posterior margin measuring 5 mm; c.) PET CT 09/05/2023: hypermetabolic RLL spiculated nodule with max  SUV 3.7    Past Surgical History:  Procedure Laterality Date   ACHILLES TENDON REPAIR Left    BRONCHOSCOPY, WITH BIOPSY USING ELECTROMAGNETIC NAVIGATION Bilateral 09/12/2023   Procedure: ROBOTIC ASSISTED NAVIGATIONAL BRONCHOSCOPY;  Surgeon: Vergia Glasgow, MD;  Location: [facility] ORS;  Service: Pulmonary;  Laterality: Bilateral;   COLONOSCOPY     HAND TENDON SURGERY Left    thumb   LEFT HEART CATH AND CORONARY ANGIOGRAPHY N/A 07/31/2023   Procedure: LEFT HEART CATH AND CORONARY ANGIOGRAPHY;  Surgeon: Knox Perl, MD;  Location: [facility];  Service: Cardiovascular;  Laterality: N/A;   TONSILLECTOMY      Family History  Problem Relation Age of Onset   Lupus Mother    Cancer - Colon Father    Cancer - Prostate Father    Heart failure Sister    Sarcoidosis Sister      Social History   Tobacco Use  Smoking Status Never  Smokeless Tobacco Never    Social History   Substance and Sexual Activity  Alcohol Use Yes   Comment: RARELY     Allergies  Allergen Reactions   Pollen Extract Cough   Benadryl [Diphenhydramine] Other (See Comments)    Could not urinate-caused prostate  issues   Mucinex Dm [Dm-Guaifenesin Er]     Could not urinate-caused prostate issues    Current Outpatient Medications  Medication Sig Dispense Refill   aspirin  EC 81 MG tablet Take 81 mg by mouth daily. Swallow whole.     Calcium Carbonate (CALCIUM 500 PO) Take 1 tablet by mouth daily.     Cholecalciferol (VITAMIN D) 50 MCG (2000 UT) tablet Take 2,000 Units by mouth daily.     levothyroxine (SYNTHROID) 150 MCG tablet Take 150 mcg by mouth daily before breakfast.     Multiple Vitamin (MULTIVITAMIN) capsule Take 1 capsule by mouth daily.     multivitamin-lutein (OCUVITE-LUTEIN) CAPS capsule Take 1 capsule by mouth daily.     Omega-3 Fatty Acids (FISH OIL) 1000 MG CAPS Take 1,000 mg by mouth daily.     rosuvastatin (CRESTOR) 40 MG tablet Take 40 mg by mouth every evening.     sacubitril-valsartan  (ENTRESTO ) 49-51 MG Take 1 tablet by mouth 2 (two) times daily. 180 tablet 3   spironolactone (ALDACTONE) 25 MG tablet Take 0.5 tablets (12.5 mg total) by mouth daily. 15 tablet 11   tadalafil (CIALIS) 5 MG tablet Take 5 mg by mouth daily as needed for erectile dysfunction.     tamsulosin  (FLOMAX ) 0.4 MG CAPS capsule Take 0.4 mg by mouth daily after breakfast.     No current facility-administered medications for this visit.    Review of Systems  Constitutional:  Negative for malaise/fatigue.  Respiratory:  Negative for cough and shortness of breath.   Cardiovascular:  Negative for chest pain.  Neurological:  Negative for dizziness.     PHYSICAL EXAMINATION: BP (!) 156/84 (BP Location: Right Arm, Patient Position: Sitting, Cuff Size: Normal)   Pulse 78   Resp 20   Ht 6' (1.829 m)   Wt 184 lb (83.5 kg)   SpO2 97% Comment: RA  BMI 24.95 kg/m  Physical Exam Constitutional:      General: He is not in acute distress.    Appearance: He is not ill-appearing.  HENT:     Head: Normocephalic and atraumatic.  Eyes:     Extraocular Movements: Extraocular movements intact.  Cardiovascular:     Rate and Rhythm: Normal rate.  Pulmonary:     Effort: Pulmonary effort is normal. No respiratory distress.  Abdominal:     General: Abdomen is flat. There is no distension.  Musculoskeletal:        General: Normal range of motion.     Cervical back: Normal range of motion.  Skin:    General: Skin is warm and dry.  Neurological:     General: No focal deficit present.     Mental Status: He is alert and oriented to person, place, and time.          I have independently reviewed the above radiology studies  and reviewed the findings with the patient.   Recent Lab Findings: Lab Results  Component Value Date   WBC 9.4 09/21/2023   HGB 13.0 09/21/2023   HCT 38.1 (L) 09/21/2023   PLT 212 09/21/2023   GLUCOSE 103 (H) 09/21/2023   ALT 16 09/21/2023   AST 24 09/21/2023   NA 139  09/21/2023   K 3.8 09/21/2023   CL 104 09/21/2023   CREATININE 1.27 (H) 09/21/2023   BUN 14 09/21/2023   CO2 31 09/21/2023    Diagnostic Studies & Laboratory data:     Recent Radiology Findings:   [facility] [address]  Result Date: 09/12/2023 CLINICAL DATA:  Status post bronchoscopy for right lower lobe nodule EXAM: PORTABLE CHEST 1 VIEW COMPARISON:  CT chest dated 09/05/2023 FINDINGS: Normal lung volumes. Nodular right basilar focus in keeping with known nodule. No pleural effusion or pneumothorax. The heart size and mediastinal contours are within normal limits. No acute osseous abnormality. IMPRESSION: 1. No pneumothorax. 2. Nodular right basilar focus in keeping with known nodule. Electronically Signed   By: Limin  Xu M.D.   On: 09/12/2023 13:45   DG C-Arm 1-60 Min-No Report Result Date: 09/12/2023 Fluoroscopy was utilized by the requesting physician.  No radiographic interpretation.   NM PET Image Initial (PI) Skull Base To Thigh (F-18 FDG) Result Date: 09/06/2023 CLINICAL DATA:  Initial treatment strategy for lung nodule. EXAM: NUCLEAR MEDICINE PET SKULL BASE TO THIGH TECHNIQUE: 9.37 mCi F-18 FDG was injected intravenously. Full-ring PET imaging was performed from the skull base to thigh after the radiotracer. CT data was obtained and used for attenuation correction and anatomic localization. Fasting blood glucose: 79 mg/dl Multidetector CT imaging of the chest was performed following the standard protocol without IV contrast. Multiplanar and three-dimensional reformatted images were generated from original data set. RADIATION DOSE REDUCTION: This exam was performed according to the departmental dose-optimization program which includes automated exposure control, adjustment of the mA and/or kV according to patient size and/or use of iterative reconstruction technique. COMPARISON:  CT 08/09/2023. FINDINGS: Mediastinal blood pool activity: SUV max 2.5 Liver activity: SUV max 2.9 NECK: No specific  abnormal radiotracer uptake in the neck including along lymph node change of the submandibular, posterior triangle or internal jugular region. Near symmetric uptake along the visualized intracranial compartment. Incidental CT findings: Visualized paranasal sinuses and mastoid air cells are clear. Parotid glands, submandibular glands are unremarkable. Small thyroid  gland. Mild vascular calcifications. CHEST: No specific abnormal radiotracer uptake above mediastinal blood pool in the axillary regions, hilum or mediastinum. However there is some uptake identified along the spiculated lesion in the anterior right lower lobe. Maximum SUV value of 3.7. No additional areas of abnormal uptake along the lung parenchyma. Incidental CT findings: Heart is nonenlarged. Trace pericardial effusion. Coronary calcifications are seen. Thoracic aorta is normal course and caliber with some mild calcified plaque. Normal caliber thoracic esophagus. Small thyroid  gland. On this non IV contrast exam no specific abnormal lymph node enlargement seen in the axillary region, hilum or mediastinum. There is some linear opacity lung bases likely scar or atelectasis. No consolidation, pneumothorax or effusion. The spiculated nodule seen in the anterior right lower lobe has some marginal ground-glass. This abuts the major fissure with some retraction. The central component of the nodule which is the most solid or dense component again measures proximally 17 x 16 mm on series 5, image 112. Small satellite semi-solid nodule posterolateral measures 7 mm at the same image. ABDOMEN/PELVIS: There is physiologic distribution radiotracer along the parenchymal organs, bowel and renal collecting systems. Very large prostate. There is some patchy uptake along the peripheral zone of the prostate posteriorly with maximum SUV approaching 50. Mild stranding. Please correlate with symptoms. Incidental CT findings: Left hepatic lobe benign-appearing cystic foci  identified. Adrenal glands are preserved. Gallbladder is nondilated. Spleen is nonenlarged. Grossly preserved pancreatic parenchyma. Motion artifact. Small nonobstructing lower pole left-sided renal stones. No right renal stones. No ureteral stones identified at this time. No bowel obstruction. Scattered colonic stool. Normal appendix. SKELETON: No specific abnormal uptake along the visualized skeleton. Incidental CT findings: Scattered degenerative changes along the spine  and pelvis. Trace anterolisthesis of L4-5 and L5-S1. IMPRESSION: Spiculated nodule along the right lower lobe is again seen and has mild abnormal radiotracer uptake of maximum SUV of 3.7. Malignant lesion is possible. Recommend further workup. No additional areas of abnormal uptake to suggest metastatic disease. There is a very large prostate with some significant uptake posteriorly along the prostate with some adjacent stranding. Please correlate for any known history including the patient's PSA. Further workup such as prostate MRI as clinically appropriate Electronically Signed   By: Adrianna Horde M.D.   On: 09/06/2023 13:59   CT SUPER D CHEST WO CONTRAST Result Date: 09/06/2023 CLINICAL DATA:  Initial treatment strategy for lung nodule. EXAM: NUCLEAR MEDICINE PET SKULL BASE TO THIGH TECHNIQUE: 9.37 mCi F-18 FDG was injected intravenously. Full-ring PET imaging was performed from the skull base to thigh after the radiotracer. CT data was obtained and used for attenuation correction and anatomic localization. Fasting blood glucose: 79 mg/dl Multidetector CT imaging of the chest was performed following the standard protocol without IV contrast. Multiplanar and three-dimensional reformatted images were generated from original data set. RADIATION DOSE REDUCTION: This exam was performed according to the departmental dose-optimization program which includes automated exposure control, adjustment of the mA and/or kV according to patient size and/or  use of iterative reconstruction technique. COMPARISON:  CT 08/09/2023. FINDINGS: Mediastinal blood pool activity: SUV max 2.5 Liver activity: SUV max 2.9 NECK: No specific abnormal radiotracer uptake in the neck including along lymph node change of the submandibular, posterior triangle or internal jugular region. Near symmetric uptake along the visualized intracranial compartment. Incidental CT findings: Visualized paranasal sinuses and mastoid air cells are clear. Parotid glands, submandibular glands are unremarkable. Small thyroid  gland. Mild vascular calcifications. CHEST: No specific abnormal radiotracer uptake above mediastinal blood pool in the axillary regions, hilum or mediastinum. However there is some uptake identified along the spiculated lesion in the anterior right lower lobe. Maximum SUV value of 3.7. No additional areas of abnormal uptake along the lung parenchyma. Incidental CT findings: Heart is nonenlarged. Trace pericardial effusion. Coronary calcifications are seen. Thoracic aorta is normal course and caliber with some mild calcified plaque. Normal caliber thoracic esophagus. Small thyroid  gland. On this non IV contrast exam no specific abnormal lymph node enlargement seen in the axillary region, hilum or mediastinum. There is some linear opacity lung bases likely scar or atelectasis. No consolidation, pneumothorax or effusion. The spiculated nodule seen in the anterior right lower lobe has some marginal ground-glass. This abuts the major fissure with some retraction. The central component of the nodule which is the most solid or dense component again measures proximally 17 x 16 mm on series 5, image 112. Small satellite semi-solid nodule posterolateral measures 7 mm at the same image. ABDOMEN/PELVIS: There is physiologic distribution radiotracer along the parenchymal organs, bowel and renal collecting systems. Very large prostate. There is some patchy uptake along the peripheral zone of the  prostate posteriorly with maximum SUV approaching 50. Mild stranding. Please correlate with symptoms. Incidental CT findings: Left hepatic lobe benign-appearing cystic foci identified. Adrenal glands are preserved. Gallbladder is nondilated. Spleen is nonenlarged. Grossly preserved pancreatic parenchyma. Motion artifact. Small nonobstructing lower pole left-sided renal stones. No right renal stones. No ureteral stones identified at this time. No bowel obstruction. Scattered colonic stool. Normal appendix. SKELETON: No specific abnormal uptake along the visualized skeleton. Incidental CT findings: Scattered degenerative changes along the spine and pelvis. Trace anterolisthesis of L4-5 and L5-S1. IMPRESSION: Spiculated nodule along the  right lower lobe is again seen and has mild abnormal radiotracer uptake of maximum SUV of 3.7. Malignant lesion is possible. Recommend further workup. No additional areas of abnormal uptake to suggest metastatic disease. There is a very large prostate with some significant uptake posteriorly along the prostate with some adjacent stranding. Please correlate for any known history including the patient's PSA. Further workup such as prostate MRI as clinically appropriate Electronically Signed   By: Adrianna Horde M.D.   On: 09/06/2023 13:59     PFTs:  - FVC: 91% - FEV1: 101% -DLCO: 94%    Pathology:  FINAL DIAGNOSIS        1. Lung, biopsy, RLL nodule :       -  ADENOCARCINOMA.       NOTE: DR. PATRICK HAS PEER-REVIEWED THE CASE AND AGREES WITH THE DIAGNOSIS.  A       SECURE CHAT MESSAGE WAS SENT TO DR. DGAYLI ON 09/14/23.   Assessment / Plan:   73 y.o. male with 1.7cm Right lower lobe NSCLC.  His pulmonary functions are acceptable.  He underwent a stress test in February of this year which was intermediate risk.  He subsequently underwent a LHC which showed LAD, and OM disease, but given that he was asymptomatic, medical therapy was recommended. He has been cleared for  surgery.  The risks and benefits of right RATS, RLLectomy were discussed in detail.  The patient is is agreeable to proceed.     I  spent 40 minutes with  the patient face to face in counseling and coordination of care.    Hilarie Lovely 09/29/2023 11:08 AM

## 2023-09-27 ENCOUNTER — Encounter: Payer: Self-pay | Admitting: Physician Assistant

## 2023-09-27 ENCOUNTER — Ambulatory Visit: Attending: Physician Assistant | Admitting: Physician Assistant

## 2023-09-27 ENCOUNTER — Other Ambulatory Visit: Payer: Self-pay | Admitting: *Deleted

## 2023-09-27 VITALS — BP 130/80 | HR 76 | Ht 72.0 in | Wt 184.2 lb

## 2023-09-27 DIAGNOSIS — I2510 Atherosclerotic heart disease of native coronary artery without angina pectoris: Secondary | ICD-10-CM

## 2023-09-27 DIAGNOSIS — I5020 Unspecified systolic (congestive) heart failure: Secondary | ICD-10-CM | POA: Diagnosis not present

## 2023-09-27 DIAGNOSIS — R911 Solitary pulmonary nodule: Secondary | ICD-10-CM | POA: Diagnosis not present

## 2023-09-27 MED ORDER — SPIRONOLACTONE 25 MG PO TABS: 12.5000 mg | ORAL_TABLET | Freq: Every day | ORAL | 11 refills | Status: AC

## 2023-09-27 NOTE — Assessment & Plan Note (Signed)
 Recent biopsy positive for adenocarcinoma.  He will likely need surgery soon.  His RCRI (revised cardiac risk index) is 3 indicating an 11% perioperative risk of major cardiac event.  This is elevated.  However, he can achieve greater than 4 METS without difficulty and is not having any unstable cardiac symptoms.  Therefore, he can proceed with surgery at acceptable CV risk.  Aspirin  can be held for 7 days prior to surgery if needed.

## 2023-09-27 NOTE — Progress Notes (Signed)
 Cardiology Office Note:    Date:  09/27/2023  ID:  Omar Hill, DOB 07/20/1950, MRN [id number] PCP: Glena Landau, MD  [facility] Providers Cardiologist:  None       Patient Profile:      Coronary artery disease Lexiscan  MPI: Inferior and inferoseptal infarct, no ischemia LHC 07/31/2023: Proximal LAD 30, mid LAD 80; ostial LCx 30, OM1 99, OM2 80; RCA mid 30, RPDA 30, RPAV 50 >> patient asymptomatic, medical therapy recommended HFmrEF (heart failure with mildly reduced ejection fraction)  TTE 07/07/2023: LBBB be related to LV dyssynchrony, EF 40-45, GR 1 DD, normal RVSF, mild LAE, AV sclerosis Hypertension  Hyperlipidemia  Left Bundle Branch Block  NSC Lung CA            Discussed the use of AI scribe software for clinical note transcription with the patient, who gave verbal consent to proceed.  History of Present Illness Omar Hill Start is a 73 y.o. male who returns for follow up of CHF. He was last seen by Dr. Alroy Aspen in 08/2023. His amlodipine was stopped and he was started on Entresto . In the interim, he underwent bronchoscopy and bx for lung nodule which was positive for adenocarcinoma.  He is here with his wife. He has not had chest pain, pressure, heaviness, tightness, shortness of breath, syncope, orthopnea, or leg swelling. He remains physically active, engaging in yard work and household activities. He is scheduled to see a surgeon on Friday to discuss the resection of the lung nodule. He reports a recent increase in coughing, which he attributes to either sinus issues or possibly as a side effect of Entresto . He is retired, having worked as Patent examiner for 23 years. He does not smoke.    ROS-See HPI        Studies Reviewed:          Risk Assessment/Calculations:             Physical Exam:   VS:  BP 130/80   Pulse 76   Ht 6' (1.829 m)   Wt 184 lb 3.2 oz (83.6 kg)   SpO2 98%   BMI 24.98 kg/m    Wt Readings from Last 3 Encounters:   09/27/23 184 lb 3.2 oz (83.6 kg)  09/21/23 186 lb 8 oz (84.6 kg)  09/19/23 184 lb 6.4 oz (83.6 kg)    Constitutional:      Appearance: Healthy appearance. Not in distress.  Neck:     Vascular: JVD normal.  Pulmonary:     Breath sounds: Normal breath sounds. No wheezing. No rales.  Cardiovascular:     Normal rate. Regular rhythm.     Murmurs: There is no murmur.  Edema:    Peripheral edema absent.  Abdominal:     Palpations: Abdomen is soft.        Assessment and Plan:   Assessment & Plan Heart failure with mildly reduced ejection fraction (HFmrEF, 41-49%) (HCC) EF 40-45. 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 NYHA I-II. Volume status stable. We discussed GDMT for CHF. He is seeing the surgeon this week to decide on timing for his CA surgery. We discussed changing Chlorthalidone to Spironolactone. But if his surgery is soon, we can hold off until after. - Continue Entresto  49/51 mg twice daily. - If cough continues or gets worse, we may need to consider decreasing dose of Entresto  - Hold off on starting spironolactone until after meeting with the surgeon on Friday. - If surgery is not anytime soon,  he will stop chlorthalidone and start spironolactone 12.5 mg once daily - He will need a BMET weekly x 2 after starting spironolactone - If his surgery is soon, he can contact us  after to decide when to start spironolactone - Will eventually need to consider beta-blocker, SGLT2 inhibitor - Keep follow-up Dr. Alroy Aspen at the end of June as planned Coronary artery disease involving native coronary artery of native heart without angina pectoris Cardiac catheterization March 2025 with severe mid LAD stenosis and severe OM1, OM 2 stenosis.  He has been asymptomatic and medical therapy has been recommended.  He remains asymptomatic without chest discomfort to suggest angina.  Continue aspirin  81 mg daily, rosuvastatin 40 mg daily. Lung nodule Recent biopsy positive for adenocarcinoma.  He will likely need  surgery soon.  His RCRI (revised cardiac risk index) is 3 indicating an 11% perioperative risk of major cardiac event.  This is elevated.  However, he can achieve greater than 4 METS without difficulty and is not having any unstable cardiac symptoms.  Therefore, he can proceed with surgery at acceptable CV risk.  Aspirin  can be held for 7 days prior to surgery if needed.       Dispo:  Return in 1 month (on 10/30/2023) for Scheduled Follow Up.  Signed, Marlyse Single, PA-C

## 2023-09-27 NOTE — Assessment & Plan Note (Signed)
 EF 40-45. 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 NYHA I-II. Volume status stable. We discussed GDMT for CHF. He is seeing the surgeon this week to decide on timing for his CA surgery. We discussed changing Chlorthalidone to Spironolactone. But if his surgery is soon, we can hold off until after. - Continue Entresto  49/51 mg twice daily. - If cough continues or gets worse, we may need to consider decreasing dose of Entresto  - Hold off on starting spironolactone until after meeting with the surgeon on Friday. - If surgery is not anytime soon, he will stop chlorthalidone and start spironolactone 12.5 mg once daily - He will need a BMET weekly x 2 after starting spironolactone - If his surgery is soon, he can contact us  after to decide when to start spironolactone - Will eventually need to consider beta-blocker, SGLT2 inhibitor - Keep follow-up Dr. Alroy Aspen at the end of June as planned

## 2023-09-27 NOTE — Patient Instructions (Signed)
 Medication Instructions:  See what the surgeon says on Friday. If your surgery is going to be very soon, hold off on the changes below until after your surgery Call us  after your surgery to discuss when to make the change. Once you are ready, make these changes: - Stop chlorthalidone - Start Spironolactone 12.5 mg once daily   *If you need a refill on your cardiac medications before your next appointment, please call your pharmacy*  Lab Work: 1 week after you start Spironolactone - BMET 2 weeks after starting Spironolactone - BMET  If you have labs (blood work) drawn today and your tests are completely normal, you will receive your results only by: MyChart Message (if you have MyChart) OR A paper copy in the mail If you have any lab test that is abnormal or we need to change your treatment, we will call you to review the results.   Follow-Up: At [facility], you and your health needs are our priority.  As part of our continuing mission to provide you with exceptional heart care, our providers are all part of one team.  This team includes your primary Cardiologist (physician) and Advanced Practice Providers or APPs (Physician Assistants and Nurse Practitioners) who all work together to provide you with the care you need, when you need it.  Your next appointment:   10/30/23 with Dr. Alroy Aspen   We recommend signing up for the patient portal called "MyChart".  Sign up information is provided on this After Visit Summary.  MyChart is used to connect with patients for Virtual Visits (Telemedicine).  Patients are able to view lab/test results, encounter notes, upcoming appointments, etc.  Non-urgent messages can be sent to your provider as well.   To learn more about what you can do with MyChart, go to [email or web address].   Other Instructions

## 2023-09-27 NOTE — Assessment & Plan Note (Signed)
 Cardiac catheterization March 2025 with severe mid LAD stenosis and severe OM1, OM 2 stenosis.  He has been asymptomatic and medical therapy has been recommended.  He remains asymptomatic without chest discomfort to suggest angina.  Continue aspirin  81 mg daily, rosuvastatin 40 mg daily.

## 2023-09-29 ENCOUNTER — Ambulatory Visit (HOSPITAL_COMMUNITY)
Admission: RE | Admit: 2023-09-29 | Discharge: 2023-09-29 | Disposition: A | Discharge status: Home / Self Care | Source: Ambulatory Visit | Attending: Internal Medicine | Admitting: Internal Medicine

## 2023-09-29 ENCOUNTER — Other Ambulatory Visit: Payer: Self-pay | Admitting: *Deleted

## 2023-09-29 ENCOUNTER — Encounter: Payer: Self-pay | Admitting: *Deleted

## 2023-09-29 ENCOUNTER — Ambulatory Visit
Attending: Thoracic Surgery (Cardiothoracic Vascular Surgery) | Admitting: Thoracic Surgery (Cardiothoracic Vascular Surgery)

## 2023-09-29 VITALS — BP 156/84 | HR 78 | Resp 20 | Ht 72.0 in | Wt 184.0 lb

## 2023-09-29 DIAGNOSIS — R911 Solitary pulmonary nodule: Secondary | ICD-10-CM | POA: Diagnosis not present

## 2023-09-29 DIAGNOSIS — G319 Degenerative disease of nervous system, unspecified: Secondary | ICD-10-CM | POA: Diagnosis not present

## 2023-09-29 DIAGNOSIS — C3490 Malignant neoplasm of unspecified part of unspecified bronchus or lung: Secondary | ICD-10-CM | POA: Insufficient documentation

## 2023-09-29 MED ORDER — GADOBUTROL 1 MMOL/ML IV SOLN
8.0000 mL | Freq: Once | INTRAVENOUS | Status: AC | PRN
  Administered 2023-09-29: 8 mL via INTRAVENOUS

## 2023-10-03 DIAGNOSIS — M25561 Pain in right knee: Secondary | ICD-10-CM | POA: Diagnosis not present

## 2023-10-03 DIAGNOSIS — M62830 Muscle spasm of back: Secondary | ICD-10-CM | POA: Diagnosis not present

## 2023-10-03 DIAGNOSIS — M25461 Effusion, right knee: Secondary | ICD-10-CM | POA: Diagnosis not present

## 2023-10-03 DIAGNOSIS — M9901 Segmental and somatic dysfunction of cervical region: Secondary | ICD-10-CM | POA: Diagnosis not present

## 2023-10-06 DIAGNOSIS — I2510 Atherosclerotic heart disease of native coronary artery without angina pectoris: Secondary | ICD-10-CM | POA: Diagnosis not present

## 2023-10-06 DIAGNOSIS — I5020 Unspecified systolic (congestive) heart failure: Secondary | ICD-10-CM | POA: Diagnosis not present

## 2023-10-07 LAB — BASIC METABOLIC PANEL WITH GFR
BUN/Creatinine Ratio: 10 (ref 10–24)
BUN: 14 mg/dL (ref 8–27)
CO2: 23 mmol/L (ref 20–29)
Calcium: 9.2 mg/dL (ref 8.6–10.2)
Chloride: 102 mmol/L (ref 96–106)
Creatinine, Ser: 1.35 mg/dL — ABNORMAL HIGH (ref 0.76–1.27)
Glucose: 94 mg/dL (ref 70–99)
Potassium: 4.5 mmol/L (ref 3.5–5.2)
Sodium: 142 mmol/L (ref 134–144)
eGFR: 56 mL/min/{1.73_m2} — ABNORMAL LOW (ref >59)

## 2023-10-08 ENCOUNTER — Ambulatory Visit: Payer: Self-pay | Admitting: Physician Assistant

## 2023-10-11 NOTE — Progress Notes (Addendum)
 Surgical Instructions   Your procedure is scheduled on Monday October 16, 2023. Report to [facility] Main Entrance "A" at 11:00 A.M., then check in with the Admitting office. Any questions or running late day of surgery: call [phone]  Questions prior to your surgery date: call [phone], Monday-Friday, 8am-4pm. If you experience any cold or flu symptoms such as cough, fever, chills, shortness of breath, etc. between now and your scheduled surgery, please notify us  at the above number.     Remember:  Do not eat or drink after midnight the night before your surgery  Take these medicines the morning of surgery with A SIP OF WATER  levothyroxine (SYNTHROID)  tamsulosin  (FLOMAX )   PER YOUR SURGEON'S INSTRUCTIONS PLEASE STOP YOUR Omega-3 Fatty Acids (FISH OIL) ONE WEEK PRIOR TO SURGERY WITH THE LAST DOSE BEING 10/08/2023.   PER YOUR SURGEON'S INSTRUCTIONS PLEASE STOP YOUR sacubitril-valsartan (ENTRESTO ) 4 DAYS PRIOR TO SURGERY WITH THE LAST DOSE BEING 10/11/2023.    DO NOT TAKE YOUR ASPIRIN  THE MORNING OF SURGERY   One week prior to surgery, STOP taking any Aleve, Naproxen, Ibuprofen, Motrin, Advil, Goody's, BC's, all herbal medications, fish oil, and non-prescription vitamins.                     Do NOT Smoke (Tobacco/Vaping) for 24 hours prior to your procedure.  If you use a CPAP at night, you may bring your mask/headgear for your overnight stay.   You will be asked to remove any contacts, glasses, piercing's, hearing aid's, dentures/partials prior to surgery. Please bring cases for these items if needed.    Patients discharged the day of surgery will not be allowed to drive home, and someone needs to stay with them for 24 hours.  SURGICAL WAITING ROOM VISITATION Patients may have no more than 2 support people in the waiting area - these visitors may rotate.   Pre-op nurse will coordinate an appropriate time for 1 ADULT support person, who may not rotate, to accompany patient in  pre-op.  Children under the age of 14 must have an adult with them who is not the patient and must remain in the main waiting area with an adult.  If the patient needs to stay at the hospital during part of their recovery, the visitor guidelines for inpatient rooms apply.  Please refer to the [facility] website for the visitor guidelines for any additional information.   If you received a COVID test during your pre-op visit  it is requested that you wear a mask when out in public, stay away from anyone that may not be feeling well and notify your surgeon if you develop symptoms. If you have been in contact with anyone that has tested positive in the last 10 days please notify you surgeon.      Pre-operative CHG Bathing Instructions   You can play a key role in reducing the risk of infection after surgery. Your skin needs to be as free of germs as possible. You can reduce the number of germs on your skin by washing with CHG (chlorhexidine  gluconate) soap before surgery. CHG is an antiseptic soap that kills germs and continues to kill germs even after washing.   DO NOT use if you have an allergy to chlorhexidine /CHG or antibacterial soaps. If your skin becomes reddened or irritated, stop using the CHG and notify one of our RNs at [phone].              TAKE A  SHOWER THE NIGHT BEFORE SURGERY AND THE DAY OF SURGERY    Please keep in mind the following:   You may shave your face before/day of surgery.  Place clean sheets on your bed the night before surgery Use a clean washcloth (not used since being washed) for each shower. DO NOT sleep with pet's night before surgery.  CHG Shower Instructions:  Wash your face and private area with normal soap. If you choose to wash your hair, wash first with your normal shampoo.  After you use shampoo/soap, rinse your hair and body thoroughly to remove shampoo/soap residue.  Turn the water OFF and apply half the bottle of CHG soap to a CLEAN  washcloth.  Apply CHG soap ONLY FROM YOUR NECK DOWN TO YOUR TOES (washing for 3-5 minutes)  DO NOT use CHG soap on face, private areas, open wounds, or sores.  Pay special attention to the area where your surgery is being performed.  If you are having back surgery, having someone wash your back for you may be helpful. Wait 2 minutes after CHG soap is applied, then you may rinse off the CHG soap.  Pat dry with a clean towel  Put on clean pajamas    Additional instructions for the day of surgery: DO NOT APPLY any lotions, deodorants or cologne.   Do not wear jewelry  Do not bring valuables to the hospital. [facility] is not responsible for valuables/personal belongings. Put on clean/comfortable clothes.  Please brush your teeth.  Ask your nurse before applying any prescription medications to the skin.

## 2023-10-12 ENCOUNTER — Encounter (HOSPITAL_COMMUNITY)
Admission: RE | Admit: 2023-10-12 | Discharge: 2023-10-12 | Disposition: A | Discharge status: Home / Self Care | Source: Ambulatory Visit | Attending: Thoracic Surgery (Cardiothoracic Vascular Surgery) | Admitting: Thoracic Surgery (Cardiothoracic Vascular Surgery)

## 2023-10-12 ENCOUNTER — Ambulatory Visit (HOSPITAL_COMMUNITY)
Admission: RE | Admit: 2023-10-12 | Discharge: 2023-10-12 | Disposition: A | Discharge status: Home / Self Care | Source: Ambulatory Visit | Attending: Thoracic Surgery (Cardiothoracic Vascular Surgery)

## 2023-10-12 ENCOUNTER — Encounter (HOSPITAL_COMMUNITY): Payer: Self-pay

## 2023-10-12 VITALS — BP 151/93 | HR 61 | Temp 98.1°F | Resp 18 | Ht 72.0 in | Wt 185.6 lb

## 2023-10-12 DIAGNOSIS — R001 Bradycardia, unspecified: Secondary | ICD-10-CM | POA: Diagnosis not present

## 2023-10-12 DIAGNOSIS — C3491 Malignant neoplasm of unspecified part of right bronchus or lung: Secondary | ICD-10-CM | POA: Diagnosis not present

## 2023-10-12 DIAGNOSIS — I5020 Unspecified systolic (congestive) heart failure: Secondary | ICD-10-CM | POA: Insufficient documentation

## 2023-10-12 DIAGNOSIS — I130 Hypertensive heart and chronic kidney disease with heart failure and stage 1 through stage 4 chronic kidney disease, or unspecified chronic kidney disease: Secondary | ICD-10-CM | POA: Insufficient documentation

## 2023-10-12 DIAGNOSIS — I498 Other specified cardiac arrhythmias: Secondary | ICD-10-CM | POA: Insufficient documentation

## 2023-10-12 DIAGNOSIS — N183 Chronic kidney disease, stage 3 unspecified: Secondary | ICD-10-CM | POA: Insufficient documentation

## 2023-10-12 DIAGNOSIS — I2510 Atherosclerotic heart disease of native coronary artery without angina pectoris: Secondary | ICD-10-CM | POA: Diagnosis not present

## 2023-10-12 DIAGNOSIS — I440 Atrioventricular block, first degree: Secondary | ICD-10-CM | POA: Diagnosis not present

## 2023-10-12 DIAGNOSIS — Z7982 Long term (current) use of aspirin: Secondary | ICD-10-CM | POA: Insufficient documentation

## 2023-10-12 DIAGNOSIS — R911 Solitary pulmonary nodule: Secondary | ICD-10-CM | POA: Diagnosis not present

## 2023-10-12 DIAGNOSIS — E785 Hyperlipidemia, unspecified: Secondary | ICD-10-CM | POA: Insufficient documentation

## 2023-10-12 DIAGNOSIS — Z01818 Encounter for other preprocedural examination: Secondary | ICD-10-CM | POA: Insufficient documentation

## 2023-10-12 DIAGNOSIS — E0500 Thyrotoxicosis with diffuse goiter without thyrotoxic crisis or storm: Secondary | ICD-10-CM | POA: Diagnosis not present

## 2023-10-12 DIAGNOSIS — I447 Left bundle-branch block, unspecified: Secondary | ICD-10-CM | POA: Insufficient documentation

## 2023-10-12 LAB — COMPREHENSIVE METABOLIC PANEL WITH GFR
ALT: 16 U/L (ref 0–44)
AST: 33 U/L (ref 15–41)
Albumin: 3.6 g/dL (ref 3.5–5.0)
Alkaline Phosphatase: 48 U/L (ref 38–126)
Anion gap: 6 (ref 5–15)
BUN: 17 mg/dL (ref 8–23)
CO2: 28 mmol/L (ref 22–32)
Calcium: 9.3 mg/dL (ref 8.9–10.3)
Chloride: 105 mmol/L (ref 98–111)
Creatinine, Ser: 1.32 mg/dL — ABNORMAL HIGH (ref 0.61–1.24)
GFR, Estimated: 57 mL/min — ABNORMAL LOW (ref >60)
Glucose, Bld: 85 mg/dL (ref 70–99)
Potassium: 4.6 mmol/L (ref 3.5–5.1)
Sodium: 139 mmol/L (ref 135–145)
Total Bilirubin: 1.1 mg/dL (ref 0.0–1.2)
Total Protein: 7.3 g/dL (ref 6.5–8.1)

## 2023-10-12 LAB — URINALYSIS, ROUTINE W REFLEX MICROSCOPIC
Bilirubin Urine: NEGATIVE
Glucose, UA: NEGATIVE mg/dL
Hgb urine dipstick: NEGATIVE
Ketones, ur: NEGATIVE mg/dL
Leukocytes,Ua: NEGATIVE
Nitrite: NEGATIVE
Protein, ur: NEGATIVE mg/dL
Specific Gravity, Urine: 1.02 (ref 1.005–1.030)
pH: 6 (ref 5.0–8.0)

## 2023-10-12 LAB — CBC
HCT: 41.9 % (ref 39.0–52.0)
Hemoglobin: 13.4 g/dL (ref 13.0–17.0)
MCH: 27.1 pg (ref 26.0–34.0)
MCHC: 32 g/dL (ref 30.0–36.0)
MCV: 84.6 fL (ref 80.0–100.0)
Platelets: 227 10*3/uL (ref 150–400)
RBC: 4.95 MIL/uL (ref 4.22–5.81)
RDW: 15.5 % (ref 11.5–15.5)
WBC: 7.1 10*3/uL (ref 4.0–10.5)
nRBC: 0 % (ref 0.0–0.2)

## 2023-10-12 LAB — FUNGUS CULTURE WITH STAIN

## 2023-10-12 LAB — SURGICAL PCR SCREEN
MRSA, PCR: NEGATIVE
Staphylococcus aureus: NEGATIVE

## 2023-10-12 LAB — FUNGUS CULTURE RESULT

## 2023-10-12 LAB — FUNGAL ORGANISM REFLEX

## 2023-10-12 LAB — PROTIME-INR
INR: 1 (ref 0.8–1.2)
Prothrombin Time: 13.8 s (ref 11.4–15.2)

## 2023-10-12 LAB — APTT: aPTT: 26 s (ref 24–36)

## 2023-10-12 NOTE — Addendum Note (Signed)
 Addended byReyne Cave, Geralyn Knee T on: 10/12/2023 10:35 AM   Modules accepted: Orders

## 2023-10-12 NOTE — Progress Notes (Signed)
 Surgical Instructions   Your procedure is scheduled on Monday October 16, 2023. Report to [facility] Main Entrance "A" at 11:00 A.M., then check in with the Admitting office. Any questions or running late day of surgery: call [phone]  Questions prior to your surgery date: call [phone], Monday-Friday, 8am-4pm. If you experience any cold or flu symptoms such as cough, fever, chills, shortness of breath, etc. between now and your scheduled surgery, please notify us  at the above number.     Remember:  Do not eat or drink after midnight the night before your surgery   Take these medicines the morning of surgery with A SIP OF WATER  levothyroxine (SYNTHROID)  tamsulosin  (FLOMAX )    STOP taking your Omega-3 Fatty Acids (FISH OIL) one week prior to surgery. Your last dose will be June 1st.   STOP taking your sacubitril-valsartan (ENTRESTO ) four days prior to surgery. Your last dose will be June 4th.  Continue taking your Aspirin  through the day before surgery. DO NOT take any the morning of surgery.   One week prior to surgery, STOP taking any Aleve, Naproxen, Ibuprofen, Motrin, Advil, Goody's, BC's, all herbal medications, fish oil, and non-prescription vitamins.                     Do NOT Smoke (Tobacco/Vaping) for 24 hours prior to your procedure.  If you use a CPAP at night, you may bring your mask/headgear for your overnight stay.   You will be asked to remove any contacts, glasses, piercing's, hearing aid's, dentures/partials prior to surgery. Please bring cases for these items if needed.    Patients discharged the day of surgery will not be allowed to drive home, and someone needs to stay with them for 24 hours.  SURGICAL WAITING ROOM VISITATION Patients may have no more than 2 support people in the waiting area - these visitors may rotate.   Pre-op nurse will coordinate an appropriate time for 1 ADULT support person, who may not rotate, to accompany patient in pre-op.   Children under the age of 62 must have an adult with them who is not the patient and must remain in the main waiting area with an adult.  If the patient needs to stay at the hospital during part of their recovery, the visitor guidelines for inpatient rooms apply.  Please refer to the [facility] website for the visitor guidelines for any additional information.   If you received a COVID test during your pre-op visit  it is requested that you wear a mask when out in public, stay away from anyone that may not be feeling well and notify your surgeon if you develop symptoms. If you have been in contact with anyone that has tested positive in the last 10 days please notify you surgeon.      Pre-operative CHG Bathing Instructions   You can play a key role in reducing the risk of infection after surgery. Your skin needs to be as free of germs as possible. You can reduce the number of germs on your skin by washing with CHG (chlorhexidine  gluconate) soap before surgery. CHG is an antiseptic soap that kills germs and continues to kill germs even after washing.   DO NOT use if you have an allergy to chlorhexidine /CHG or antibacterial soaps. If your skin becomes reddened or irritated, stop using the CHG and notify one of our RNs at [phone].  TAKE A SHOWER THE NIGHT BEFORE SURGERY AND THE DAY OF SURGERY    Please keep in mind the following:   You may shave your face before/day of surgery.  Place clean sheets on your bed the night before surgery Use a clean washcloth (not used since being washed) for each shower. DO NOT sleep with pet's night before surgery.  CHG Shower Instructions:  Wash your face and private area with normal soap. If you choose to wash your hair, wash first with your normal shampoo.  After you use shampoo/soap, rinse your hair and body thoroughly to remove shampoo/soap residue.  Turn the water OFF and apply half the bottle of CHG soap to a CLEAN washcloth.   Apply CHG soap ONLY FROM YOUR NECK DOWN TO YOUR TOES (washing for 3-5 minutes)  DO NOT use CHG soap on face, private areas, open wounds, or sores.  Pay special attention to the area where your surgery is being performed.  If you are having back surgery, having someone wash your back for you may be helpful. Wait 2 minutes after CHG soap is applied, then you may rinse off the CHG soap.  Pat dry with a clean towel  Put on clean pajamas    Additional instructions for the day of surgery: DO NOT APPLY any lotions, deodorants or cologne.   Do not wear jewelry  Do not bring valuables to the hospital. [facility] is not responsible for valuables/personal belongings. Put on clean/comfortable clothes.  Please brush your teeth.  Ask your nurse before applying any prescription medications to the skin.

## 2023-10-12 NOTE — Progress Notes (Signed)
 PCP - Dr. Glena Landau Cardiologist - Dr. Ahmad Alert - Last office visit 08/31/2023 Pulmonologist - Dr. Vergia Glasgow  PPM/ICD - Denies Device Orders - n/a Rep Notified - n/a  Chest x-ray - 10/12/2023 EKG - 10/12/2023 Stress Test - 07/07/2023 ECHO - 07/07/2023 Cardiac Cath - 07/31/2023  Sleep Study - Denies CPAP - n/a  No DM  Last dose of GLP1 agonist- n/a GLP1 instructions: n/a  Blood Thinner Instructions: n/a Aspirin  Instructions: Pt instructed to continue taking ASA through the day before surgery and none the morning of surgery  NPO after midnight  COVID TEST- n/a   Anesthesia review: Yes. Cardiac Clearance.    Patient denies shortness of breath, fever, cough and chest pain at PAT appointment. Pt denies any respiratory illness/infection in the last two months.    All instructions explained to the patient, with a verbal understanding of the material. Patient agrees to go over the instructions while at home for a better understanding. Patient also instructed to self quarantine after being tested for COVID-19. The opportunity to ask questions was provided.

## 2023-10-12 NOTE — Progress Notes (Signed)
 Pt was scheduled to have BMP collected at [facility], June 6th, per cardiology. Pt is having CMP drawn today per Dr. Raina Bunting surgical orders. RN spoke with Marlyse Single, PA-C with cardiology who agreed that [facility] today is fine and pt does not need lab collection tomorrow. Pt made aware and RN asked Geralyn Knee to please discontinue the [facility] order.

## 2023-10-13 NOTE — Anesthesia Preprocedure Evaluation (Addendum)
 Anesthesia Evaluation  Patient identified by MRN, date of birth, ID band Patient awake    Reviewed: Allergy & Precautions, NPO status , Patient's Chart, lab work & pertinent test results, reviewed documented beta blocker date and time   Airway Mallampati: III  TM Distance: >3 FB     Dental no notable dental hx.    Pulmonary neg COPD   breath sounds clear to auscultation       Cardiovascular hypertension, + CAD, + Past MI and +CHF  + dysrhythmias  Rhythm:Regular Rate:Normal  IMPRESSIONS     1. There is profound LBBB-related left ventricular dyssynchrony. Left  ventricular ejection fraction, by estimation, is 40 to 45%. The left  ventricle has mildly decreased function. The left ventricle demonstrates  global hypokinesis. There is mild  concentric left ventricular hypertrophy. Left ventricular diastolic  parameters are consistent with Grade I diastolic dysfunction (impaired  relaxation). The average left ventricular global longitudinal strain is  -22.2 %. The global longitudinal strain is  normal.   2. Right ventricular systolic function is normal. The right ventricular  size is normal. Tricuspid regurgitation signal is inadequate for assessing  PA pressure.   3. Left atrial size was mildly dilated.   4. The mitral valve is normal in structure. No evidence of mitral valve  regurgitation.   5. The aortic valve is tricuspid. There is mild calcification of the  aortic valve. There is mild thickening of the aortic valve. Aortic valve  regurgitation is not visualized. Aortic valve sclerosis/calcification is  present, without any evidence of  aortic stenosis.   6. The inferior vena cava is normal in size with greater than 50%  respiratory variability, suggesting right atrial pressure of 3 mmHg.     Neuro/Psych neg Seizures    GI/Hepatic   Endo/Other  Hypothyroidism    Renal/GU Renal disease     Musculoskeletal    Abdominal   Peds  Hematology   Anesthesia Other Findings   Reproductive/Obstetrics                              Anesthesia Physical Anesthesia Plan  ASA: 3  Anesthesia Plan: General   Post-op Pain Management:    Induction: Intravenous  PONV Risk Score and Plan: 2 and Ondansetron  and Dexamethasone   Airway Management Planned: Double Lumen EBT  Additional Equipment: Arterial line  Intra-op Plan:   Post-operative Plan:   Informed Consent: I have reviewed the patients History and Physical, chart, labs and discussed the procedure including the risks, benefits and alternatives for the proposed anesthesia with the patient or authorized representative who has indicated his/her understanding and acceptance.     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  Last seen by Marlyse Single, PA-C on 09/27/2023 and need for CT surgery was discussed.  Per note, Recent biopsy positive for adenocarcinoma. He will likely need surgery soon. His RCRI (revised cardiac risk index) is 3 indicating an 11% perioperative risk of major cardiac event. This is elevated. However, he can achieve greater than 4 METS without difficulty and  is not having any unstable cardiac symptoms. Therefore, he can proceed with surgery at acceptable CV risk. Aspirin  can be held for 7 days prior to surgery if needed.  Patient incidentally found to have 1.4 cm pulmonary nodule on CT calcium scoring done in January 2025. Follow-up HRCT 08/09/2023 showed partially solid spiculated and measuring 1.7 x 2.5 cm with an internal 1.3 x  1.6 cm solid component. PET/CT hypermetabolic with a maximum SUV of 3.7.  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  Subsequently seen by Dr. Deloise Ferries on 09/29/2023, right lower lobectomy recommended.  PFTs 09/19/2023 were normal, FVC 91%, FEV1 101%, ratio 81%, TLC 109%, RV 129%, DLCO 94%.  Other pertinent history includes Graves' disease s/p radioactive iodine ablation, CKD 3.  Preop labs reviewed, creatinine mildly elevated at 1.32, otherwise unremarkable.  EKG 10/12/2023: Sinus bradycardia with sinus arrhythmia with 1st degree A-V block.  Rate 57. Possible Left atrial enlargement. Left axis deviation. Left bundle branch block  CT super D chest 09/05/2023: IMPRESSION: Spiculated nodule along the right lower lobe is again seen and has mild abnormal radiotracer uptake of maximum SUV of 3.7. Malignant lesion is possible. Recommend further workup.   No additional areas of abnormal uptake to suggest metastatic disease.   There is a very large prostate with some significant uptake posteriorly along the prostate with some adjacent stranding. Please correlate for any known history including the patient's PSA. Further workup such as prostate MRI as clinically appropriate  Left Heart Catheterization 07/31/23: Hemodynamic data: LVEDP 11 mmHg, no pressure gradient across the aortic valve.   Angiographic data: There is severe diffuse coronary calcification involving the proximal RCA, LM, LAD and CX much more severe in LAD. LM: Large-caliber vessel, it is disease-free. LCx: Large-caliber vessel, gives origin to a very large OM 2 with a ostial 80% stenosis and OM 3, disease-free. LAD: Large-caliber vessel, gives origin to a small sized D1 which has secondary branch, D1 is diffusely diseased.  There is a prox LAD 30% stenosis followed by origin of a moderate to large size D2 with minimal disease and moderate-sized D3 that is diffusely diseased.  Apical LAD has minimal  disease.  Impression and recommendations: Patient has angiographically significant mid LAD stenosis and also OM 2 branch of CX stenosis.  However patient is completely asymptomatic without chest pain or symptoms of heart failure, states that he walks at least 30 minutes on a daily basis in a brisk fashion without any symptoms.  In view of this, although has calcific high-grade stenosis in the mid LAD, would recommend medical therapy in the absence of symptoms.  As proximal LAD is not involved, would recommend continued medical therapy.  TTE 07/07/2023:  1. There is profound LBBB-related left ventricular dyssynchrony. Left  ventricular ejection fraction, by estimation, is 40 to 45%. The left  ventricle has mildly decreased function. The left ventricle demonstrates  global hypokinesis. There is mild  concentric left ventricular hypertrophy. Left ventricular diastolic  parameters are consistent with Grade I diastolic dysfunction (impaired  relaxation). The average left ventricular global longitudinal strain is  -22.2 %. The global longitudinal strain is  normal.   2. Right ventricular systolic function is normal. The right ventricular  size is normal. Tricuspid regurgitation signal is inadequate for assessing  PA pressure.   3. Left atrial size was mildly dilated.   4. The mitral valve is normal in structure. No evidence of mitral valve  regurgitation.   5. The aortic valve is tricuspid. There is mild calcification of  the  aortic valve. There is mild thickening of the aortic valve. Aortic valve  regurgitation is not visualized. Aortic valve sclerosis/calcification is  present, without any evidence of  aortic stenosis.   6. The inferior vena cava is normal in size with greater than 50%  respiratory variability, suggesting right atrial pressure of 3 mmHg.   )         Anesthesia Quick Evaluation

## 2023-10-13 NOTE — Progress Notes (Signed)
 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  Last seen by Marlyse Single, PA-C on 09/27/2023 and need for CT surgery was discussed.  Per note, "Recent biopsy positive for adenocarcinoma. He will likely need surgery soon. His RCRI (revised cardiac risk index) is 3 indicating an 11% perioperative risk of major cardiac event. This is elevated. However, he can achieve greater than 4 METS without difficulty and is not having any unstable cardiac symptoms. Therefore, he can proceed with surgery at acceptable CV risk. Aspirin  can be held for 7 days prior to surgery if needed."  Patient incidentally found to have 1.4 cm pulmonary nodule on CT calcium scoring done in January 2025. Follow-up HRCT 08/09/2023 showed partially solid spiculated and measuring 1.7 x 2.5 cm with an internal 1.3 x 1.6 cm solid component. PET/CT hypermetabolic with a maximum SUV of 3.7.  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  Subsequently seen by Dr. Deloise Ferries on 09/29/2023, right lower lobectomy recommended.  PFTs 09/19/2023 were normal, FVC 91%, FEV1 101%, ratio 81%, TLC 109%, RV 129%, DLCO 94%.  Other pertinent history includes Graves' disease s/p radioactive iodine ablation, CKD 3.  Preop labs reviewed, creatinine mildly elevated at 1.32, otherwise unremarkable.  EKG 10/12/2023: Sinus bradycardia with sinus arrhythmia with 1st degree A-V block.  Rate 57. Possible Left atrial enlargement. Left axis deviation. Left bundle branch block  CT super D chest 09/05/2023: IMPRESSION: Spiculated nodule along the right lower lobe is again seen and has mild abnormal radiotracer uptake of maximum SUV of 3.7. Malignant lesion is possible.  Recommend further workup.   No additional areas of abnormal uptake to suggest metastatic disease.   There is a very large prostate with some significant uptake posteriorly along the prostate with some adjacent stranding. Please correlate for any known history including the patient's PSA. Further workup such as prostate MRI as clinically appropriate  Left Heart Catheterization 07/31/23: Hemodynamic data: LVEDP 11 mmHg, no pressure gradient across the aortic valve.   Angiographic data: There is severe diffuse coronary calcification involving the proximal RCA, LM, LAD and CX much more severe in LAD. LM: Large-caliber vessel, it is disease-free. LCx: Large-caliber vessel, gives origin to a very large OM 2 with a ostial 80% stenosis and OM 3, disease-free. LAD: Large-caliber vessel, gives origin to a small sized D1 which has secondary branch, D1 is diffusely diseased.  There is a prox LAD 30% stenosis followed by origin of a moderate to large size D2 with minimal disease and moderate-sized D3 that is diffusely diseased.  Apical LAD has minimal disease.  Impression and recommendations: Patient has angiographically significant mid LAD stenosis and also OM 2 branch of CX stenosis.  However patient is completely asymptomatic without chest pain or symptoms of heart failure, states that he walks at least 30 minutes on a daily basis in a brisk fashion without any symptoms.  In view of this, although has calcific high-grade stenosis in the mid LAD, would recommend medical therapy in the absence of symptoms.  As proximal LAD is not involved, would recommend continued medical therapy.  TTE 07/07/2023:  1. There is profound LBBB-related left ventricular dyssynchrony. Left  ventricular ejection fraction, by estimation, is 40 to 45%. The  left  ventricle has mildly decreased function. The left ventricle demonstrates  global hypokinesis. There is mild  concentric left ventricular hypertrophy. Left ventricular  diastolic  parameters are consistent with Grade I diastolic dysfunction (impaired  relaxation). The average left ventricular global longitudinal strain is  -22.2 %. The global longitudinal strain is  normal.   2. Right ventricular systolic function is normal. The right ventricular  size is normal. Tricuspid regurgitation signal is inadequate for assessing  PA pressure.   3. Left atrial size was mildly dilated.   4. The mitral valve is normal in structure. No evidence of mitral valve  regurgitation.   5. The aortic valve is tricuspid. There is mild calcification of the  aortic valve. There is mild thickening of the aortic valve. Aortic valve  regurgitation is not visualized. Aortic valve sclerosis/calcification is  present, without any evidence of  aortic stenosis.   6. The inferior vena cava is normal in size with greater than 50%  respiratory variability, suggesting right atrial pressure of 3 mmHg.      Edilia Gordon [facility] Short Stay Center/Anesthesiology Phone [phone] 10/13/2023 9:06 AM

## 2023-10-16 ENCOUNTER — Other Ambulatory Visit: Payer: Self-pay

## 2023-10-16 ENCOUNTER — Inpatient Hospital Stay (HOSPITAL_COMMUNITY)

## 2023-10-16 ENCOUNTER — Inpatient Hospital Stay (HOSPITAL_COMMUNITY)
Admission: RE | Admit: 2023-10-16 | Discharge: 2023-10-19 | DRG: 164 | Disposition: A | Discharge status: Home / Self Care | Attending: Thoracic Surgery (Cardiothoracic Vascular Surgery) | Admitting: Thoracic Surgery (Cardiothoracic Vascular Surgery)

## 2023-10-16 ENCOUNTER — Inpatient Hospital Stay (HOSPITAL_COMMUNITY): Payer: Self-pay | Admitting: Physician Assistant

## 2023-10-16 ENCOUNTER — Encounter (HOSPITAL_COMMUNITY): Payer: Self-pay | Admitting: Thoracic Surgery (Cardiothoracic Vascular Surgery)

## 2023-10-16 ENCOUNTER — Inpatient Hospital Stay (HOSPITAL_COMMUNITY): Payer: Self-pay

## 2023-10-16 ENCOUNTER — Encounter (HOSPITAL_COMMUNITY)
Admission: RE | Disposition: A | Discharge status: Home / Self Care | Payer: Self-pay | Source: Home / Self Care | Attending: Thoracic Surgery (Cardiothoracic Vascular Surgery)

## 2023-10-16 DIAGNOSIS — Z8269 Family history of other diseases of the musculoskeletal system and connective tissue: Secondary | ICD-10-CM | POA: Diagnosis not present

## 2023-10-16 DIAGNOSIS — I447 Left bundle-branch block, unspecified: Secondary | ICD-10-CM | POA: Diagnosis present

## 2023-10-16 DIAGNOSIS — Z48813 Encounter for surgical aftercare following surgery on the respiratory system: Secondary | ICD-10-CM | POA: Diagnosis not present

## 2023-10-16 DIAGNOSIS — E890 Postprocedural hypothyroidism: Secondary | ICD-10-CM | POA: Diagnosis present

## 2023-10-16 DIAGNOSIS — Z8249 Family history of ischemic heart disease and other diseases of the circulatory system: Secondary | ICD-10-CM | POA: Diagnosis not present

## 2023-10-16 DIAGNOSIS — J95811 Postprocedural pneumothorax: Secondary | ICD-10-CM | POA: Diagnosis not present

## 2023-10-16 DIAGNOSIS — I252 Old myocardial infarction: Secondary | ICD-10-CM

## 2023-10-16 DIAGNOSIS — R0989 Other specified symptoms and signs involving the circulatory and respiratory systems: Secondary | ICD-10-CM | POA: Diagnosis not present

## 2023-10-16 DIAGNOSIS — Z888 Allergy status to other drugs, medicaments and biological substances status: Secondary | ICD-10-CM | POA: Diagnosis not present

## 2023-10-16 DIAGNOSIS — N400 Enlarged prostate without lower urinary tract symptoms: Secondary | ICD-10-CM | POA: Diagnosis present

## 2023-10-16 DIAGNOSIS — Z7982 Long term (current) use of aspirin: Secondary | ICD-10-CM

## 2023-10-16 DIAGNOSIS — R918 Other nonspecific abnormal finding of lung field: Secondary | ICD-10-CM | POA: Diagnosis not present

## 2023-10-16 DIAGNOSIS — I5020 Unspecified systolic (congestive) heart failure: Secondary | ICD-10-CM | POA: Diagnosis not present

## 2023-10-16 DIAGNOSIS — Z79890 Hormone replacement therapy (postmenopausal): Secondary | ICD-10-CM | POA: Diagnosis not present

## 2023-10-16 DIAGNOSIS — N183 Chronic kidney disease, stage 3 unspecified: Secondary | ICD-10-CM | POA: Diagnosis not present

## 2023-10-16 DIAGNOSIS — Y838 Other surgical procedures as the cause of abnormal reaction of the patient, or of later complication, without mention of misadventure at the time of the procedure: Secondary | ICD-10-CM | POA: Diagnosis not present

## 2023-10-16 DIAGNOSIS — I110 Hypertensive heart disease with heart failure: Secondary | ICD-10-CM | POA: Diagnosis not present

## 2023-10-16 DIAGNOSIS — Z9889 Other specified postprocedural states: Secondary | ICD-10-CM | POA: Diagnosis not present

## 2023-10-16 DIAGNOSIS — Z79899 Other long term (current) drug therapy: Secondary | ICD-10-CM | POA: Diagnosis not present

## 2023-10-16 DIAGNOSIS — C3431 Malignant neoplasm of lower lobe, right bronchus or lung: Secondary | ICD-10-CM

## 2023-10-16 DIAGNOSIS — I130 Hypertensive heart and chronic kidney disease with heart failure and stage 1 through stage 4 chronic kidney disease, or unspecified chronic kidney disease: Secondary | ICD-10-CM | POA: Diagnosis present

## 2023-10-16 DIAGNOSIS — D72829 Elevated white blood cell count, unspecified: Secondary | ICD-10-CM | POA: Diagnosis not present

## 2023-10-16 DIAGNOSIS — R911 Solitary pulmonary nodule: Secondary | ICD-10-CM

## 2023-10-16 DIAGNOSIS — C3491 Malignant neoplasm of unspecified part of right bronchus or lung: Secondary | ICD-10-CM

## 2023-10-16 DIAGNOSIS — I2510 Atherosclerotic heart disease of native coronary artery without angina pectoris: Secondary | ICD-10-CM | POA: Diagnosis present

## 2023-10-16 DIAGNOSIS — Z4682 Encounter for fitting and adjustment of non-vascular catheter: Secondary | ICD-10-CM | POA: Diagnosis not present

## 2023-10-16 DIAGNOSIS — I5022 Chronic systolic (congestive) heart failure: Secondary | ICD-10-CM | POA: Diagnosis not present

## 2023-10-16 DIAGNOSIS — C3490 Malignant neoplasm of unspecified part of unspecified bronchus or lung: Secondary | ICD-10-CM

## 2023-10-16 DIAGNOSIS — J9811 Atelectasis: Secondary | ICD-10-CM | POA: Diagnosis not present

## 2023-10-16 DIAGNOSIS — I898 Other specified noninfective disorders of lymphatic vessels and lymph nodes: Secondary | ICD-10-CM | POA: Diagnosis not present

## 2023-10-16 DIAGNOSIS — J939 Pneumothorax, unspecified: Secondary | ICD-10-CM | POA: Diagnosis not present

## 2023-10-16 DIAGNOSIS — I517 Cardiomegaly: Secondary | ICD-10-CM | POA: Diagnosis not present

## 2023-10-16 DIAGNOSIS — Z902 Acquired absence of lung [part of]: Principal | ICD-10-CM

## 2023-10-16 HISTORY — PX: INTERCOSTAL NERVE BLOCK: SHX5021

## 2023-10-16 HISTORY — PX: LOBECTOMY, LUNG, ROBOT-ASSISTED, USING VATS: SHX7607

## 2023-10-16 HISTORY — PX: LYMPH NODE BIOPSY: SHX201

## 2023-10-16 LAB — BPAM RBC
Blood Product Expiration Date: 202507032359
Blood Product Expiration Date: 202507032359
Unit Type and Rh: 7300
Unit Type and Rh: 7300

## 2023-10-16 LAB — PREPARE RBC (CROSSMATCH)

## 2023-10-16 LAB — TYPE AND SCREEN
ABO/RH(D): B POS
Antibody Screen: NEGATIVE
Unit division: 0
Unit division: 0

## 2023-10-16 LAB — ABO/RH: ABO/RH(D): B POS

## 2023-10-16 SURGERY — LOBECTOMY, LUNG, ROBOT-ASSISTED, USING VATS
Anesthesia: General | Site: Chest | Laterality: Right

## 2023-10-16 MED ORDER — DEXAMETHASONE SODIUM PHOSPHATE 10 MG/ML IJ SOLN
INTRAMUSCULAR | Status: AC
  Filled 2023-10-16: qty 1

## 2023-10-16 MED ORDER — TRAMADOL HCL 50 MG PO TABS
50.0000 mg | ORAL_TABLET | Freq: Four times a day (QID) | ORAL | Status: DC | PRN
  Administered 2023-10-16 – 2023-10-19 (×5): 100 mg via ORAL
  Filled 2023-10-16 (×5): qty 2

## 2023-10-16 MED ORDER — ONDANSETRON HCL 4 MG/2ML IJ SOLN
INTRAMUSCULAR | Status: DC | PRN
  Administered 2023-10-16: 4 mg via INTRAVENOUS

## 2023-10-16 MED ORDER — ACETAMINOPHEN 10 MG/ML IV SOLN: 1000.0000 mg | Freq: Once | INTRAVENOUS | Status: DC | PRN

## 2023-10-16 MED ORDER — MORPHINE SULFATE (PF) 2 MG/ML IV SOLN: 2.0000 mg | INTRAVENOUS | Status: DC | PRN

## 2023-10-16 MED ORDER — LIDOCAINE 2% (20 MG/ML) 5 ML SYRINGE
INTRAMUSCULAR | Status: AC
Start: 2023-10-16 — End: ?
  Filled 2023-10-16: qty 5

## 2023-10-16 MED ORDER — METHOCARBAMOL 500 MG PO TABS: 500.0000 mg | ORAL_TABLET | Freq: Three times a day (TID) | ORAL | Status: DC | PRN

## 2023-10-16 MED ORDER — ONDANSETRON HCL 4 MG/2ML IJ SOLN: 4.0000 mg | Freq: Four times a day (QID) | INTRAMUSCULAR | Status: DC | PRN

## 2023-10-16 MED ORDER — PHENYLEPHRINE 80 MCG/ML (10ML) SYRINGE FOR IV PUSH (FOR BLOOD PRESSURE SUPPORT)
PREFILLED_SYRINGE | INTRAVENOUS | Status: DC | PRN
  Administered 2023-10-16 (×2): 80 ug via INTRAVENOUS
  Administered 2023-10-16: 160 ug via INTRAVENOUS
  Administered 2023-10-16 (×3): 80 ug via INTRAVENOUS

## 2023-10-16 MED ORDER — ENOXAPARIN SODIUM 40 MG/0.4ML IJ SOSY
40.0000 mg | PREFILLED_SYRINGE | Freq: Every day | INTRAMUSCULAR | Status: DC
  Administered 2023-10-17 – 2023-10-19 (×3): 40 mg via SUBCUTANEOUS
  Filled 2023-10-16 (×3): qty 0.4

## 2023-10-16 MED ORDER — SUGAMMADEX SODIUM 200 MG/2ML IV SOLN
INTRAVENOUS | Status: DC | PRN
  Administered 2023-10-16: 200 mg via INTRAVENOUS

## 2023-10-16 MED ORDER — FENTANYL CITRATE (PF) 250 MCG/5ML IJ SOLN
INTRAMUSCULAR | Status: AC
  Filled 2023-10-16: qty 5

## 2023-10-16 MED ORDER — BISACODYL 5 MG PO TBEC
10.0000 mg | DELAYED_RELEASE_TABLET | Freq: Every day | ORAL | Status: DC
  Administered 2023-10-16 – 2023-10-17 (×2): 10 mg via ORAL
  Filled 2023-10-16 (×3): qty 2

## 2023-10-16 MED ORDER — EPHEDRINE SULFATE-NACL 50-0.9 MG/10ML-% IV SOSY
PREFILLED_SYRINGE | INTRAVENOUS | Status: DC | PRN
  Administered 2023-10-16: 10 mg via INTRAVENOUS
  Administered 2023-10-16 (×2): 5 mg via INTRAVENOUS

## 2023-10-16 MED ORDER — SODIUM CHLORIDE (PF) 0.9 % IJ SOLN
INTRAMUSCULAR | Status: AC
  Filled 2023-10-16: qty 50

## 2023-10-16 MED ORDER — BUPIVACAINE HCL (PF) 0.5 % IJ SOLN
INTRAMUSCULAR | Status: AC
  Filled 2023-10-16: qty 30

## 2023-10-16 MED ORDER — TAMSULOSIN HCL 0.4 MG PO CAPS
0.4000 mg | ORAL_CAPSULE | Freq: Every day | ORAL | Status: DC
  Administered 2023-10-17 – 2023-10-19 (×3): 0.4 mg via ORAL
  Filled 2023-10-16 (×3): qty 1

## 2023-10-16 MED ORDER — PROPOFOL 10 MG/ML IV BOLUS
INTRAVENOUS | Status: AC
  Filled 2023-10-16: qty 20

## 2023-10-16 MED ORDER — SODIUM CHLORIDE 0.9 % IV SOLN: INTRAVENOUS | Status: DC | PRN

## 2023-10-16 MED ORDER — OXYCODONE HCL 5 MG/5ML PO SOLN: 5.0000 mg | Freq: Once | ORAL | Status: DC | PRN

## 2023-10-16 MED ORDER — ROSUVASTATIN CALCIUM 20 MG PO TABS
40.0000 mg | ORAL_TABLET | Freq: Every evening | ORAL | Status: DC
  Administered 2023-10-16 – 2023-10-18 (×3): 40 mg via ORAL
  Filled 2023-10-16 (×3): qty 2

## 2023-10-16 MED ORDER — MIDAZOLAM HCL 2 MG/2ML IJ SOLN
INTRAMUSCULAR | Status: AC
  Filled 2023-10-16: qty 2

## 2023-10-16 MED ORDER — PHENYLEPHRINE 80 MCG/ML (10ML) SYRINGE FOR IV PUSH (FOR BLOOD PRESSURE SUPPORT)
PREFILLED_SYRINGE | INTRAVENOUS | Status: AC
  Filled 2023-10-16: qty 10

## 2023-10-16 MED ORDER — LACTATED RINGERS IV SOLN: INTRAVENOUS | Status: DC

## 2023-10-16 MED ORDER — ACETAMINOPHEN 10 MG/ML IV SOLN
INTRAVENOUS | Status: DC | PRN
Start: 2023-10-16 — End: 2023-10-16
  Administered 2023-10-16: 1000 mg via INTRAVENOUS

## 2023-10-16 MED ORDER — METOCLOPRAMIDE HCL 5 MG/ML IJ SOLN
10.0000 mg | Freq: Four times a day (QID) | INTRAMUSCULAR | Status: AC
  Administered 2023-10-16 – 2023-10-17 (×4): 10 mg via INTRAVENOUS
  Filled 2023-10-16 (×4): qty 2

## 2023-10-16 MED ORDER — PANTOPRAZOLE SODIUM 40 MG PO TBEC
40.0000 mg | DELAYED_RELEASE_TABLET | Freq: Every day | ORAL | Status: DC
  Administered 2023-10-17 – 2023-10-19 (×3): 40 mg via ORAL
  Filled 2023-10-16 (×3): qty 1

## 2023-10-16 MED ORDER — ONDANSETRON HCL 4 MG/2ML IJ SOLN
INTRAMUSCULAR | Status: AC
  Filled 2023-10-16: qty 2

## 2023-10-16 MED ORDER — DEXAMETHASONE SODIUM PHOSPHATE 10 MG/ML IJ SOLN
INTRAMUSCULAR | Status: DC | PRN
  Administered 2023-10-16: 5 mg via INTRAVENOUS

## 2023-10-16 MED ORDER — PHENYLEPHRINE HCL-NACL 20-0.9 MG/250ML-% IV SOLN
INTRAVENOUS | Status: DC | PRN
  Administered 2023-10-16: 30 ug/min via INTRAVENOUS

## 2023-10-16 MED ORDER — ACETAMINOPHEN 500 MG PO TABS
1000.0000 mg | ORAL_TABLET | Freq: Four times a day (QID) | ORAL | Status: DC
Start: 2023-10-16 — End: 2023-10-21
  Administered 2023-10-17 – 2023-10-19 (×11): 1000 mg via ORAL
  Filled 2023-10-16 (×12): qty 2

## 2023-10-16 MED ORDER — SPIRONOLACTONE 12.5 MG HALF TABLET
12.5000 mg | ORAL_TABLET | Freq: Every day | ORAL | Status: DC
  Administered 2023-10-17 – 2023-10-19 (×3): 12.5 mg via ORAL
  Filled 2023-10-16 (×3): qty 1

## 2023-10-16 MED ORDER — OXYCODONE HCL 5 MG PO TABS
5.0000 mg | ORAL_TABLET | ORAL | Status: DC | PRN
  Administered 2023-10-18 – 2023-10-19 (×3): 10 mg via ORAL
  Filled 2023-10-16 (×3): qty 2

## 2023-10-16 MED ORDER — PROPOFOL 10 MG/ML IV BOLUS
INTRAVENOUS | Status: DC | PRN
  Administered 2023-10-16: 100 mg via INTRAVENOUS
  Administered 2023-10-16: 50 mg via INTRAVENOUS

## 2023-10-16 MED ORDER — ORAL CARE MOUTH RINSE: 15.0000 mL | Freq: Once | OROMUCOSAL | Status: AC

## 2023-10-16 MED ORDER — ROCURONIUM BROMIDE 10 MG/ML (PF) SYRINGE
PREFILLED_SYRINGE | INTRAVENOUS | Status: DC | PRN
  Administered 2023-10-16: 20 mg via INTRAVENOUS
  Administered 2023-10-16: 80 mg via INTRAVENOUS

## 2023-10-16 MED ORDER — SENNOSIDES-DOCUSATE SODIUM 8.6-50 MG PO TABS
1.0000 | ORAL_TABLET | Freq: Every day | ORAL | Status: DC
  Administered 2023-10-16: 1 via ORAL
  Filled 2023-10-16 (×2): qty 1

## 2023-10-16 MED ORDER — ACETAMINOPHEN 160 MG/5ML PO SOLN
1000.0000 mg | Freq: Four times a day (QID) | ORAL | Status: DC
Start: 2023-10-16 — End: 2023-10-21

## 2023-10-16 MED ORDER — BUPIVACAINE LIPOSOME 1.3 % IJ SUSP
INTRAMUSCULAR | Status: AC
  Filled 2023-10-16: qty 20

## 2023-10-16 MED ORDER — LIDOCAINE 2% (20 MG/ML) 5 ML SYRINGE
INTRAMUSCULAR | Status: DC | PRN
  Administered 2023-10-16: 100 mg via INTRAVENOUS

## 2023-10-16 MED ORDER — ROCURONIUM BROMIDE 10 MG/ML (PF) SYRINGE
PREFILLED_SYRINGE | INTRAVENOUS | Status: AC
  Filled 2023-10-16: qty 10

## 2023-10-16 MED ORDER — MIDAZOLAM HCL 2 MG/2ML IJ SOLN
INTRAMUSCULAR | Status: DC | PRN
  Administered 2023-10-16: 2 mg via INTRAVENOUS

## 2023-10-16 MED ORDER — LEVOTHYROXINE SODIUM 75 MCG PO TABS
150.0000 ug | ORAL_TABLET | Freq: Every day | ORAL | Status: DC
  Administered 2023-10-17 – 2023-10-19 (×3): 150 ug via ORAL
  Filled 2023-10-16 (×3): qty 2

## 2023-10-16 MED ORDER — OXYCODONE HCL 5 MG PO TABS: 5.0000 mg | ORAL_TABLET | Freq: Once | ORAL | Status: DC | PRN

## 2023-10-16 MED ORDER — SODIUM CHLORIDE FLUSH 0.9 % IV SOLN
INTRAVENOUS | Status: DC | PRN
  Administered 2023-10-16: 100 mL

## 2023-10-16 MED ORDER — ASPIRIN 81 MG PO TBEC
81.0000 mg | DELAYED_RELEASE_TABLET | Freq: Every day | ORAL | Status: DC
  Administered 2023-10-17 – 2023-10-19 (×3): 81 mg via ORAL
  Filled 2023-10-16 (×3): qty 1

## 2023-10-16 MED ORDER — CHLORHEXIDINE GLUCONATE 0.12 % MT SOLN
15.0000 mL | Freq: Once | OROMUCOSAL | Status: AC
  Administered 2023-10-16: 15 mL via OROMUCOSAL
  Filled 2023-10-16: qty 15

## 2023-10-16 MED ORDER — CEFAZOLIN SODIUM-DEXTROSE 2-4 GM/100ML-% IV SOLN
2.0000 g | Freq: Three times a day (TID) | INTRAVENOUS | Status: AC
  Administered 2023-10-16 – 2023-10-17 (×2): 2 g via INTRAVENOUS
  Filled 2023-10-16 (×2): qty 100

## 2023-10-16 MED ORDER — CEFAZOLIN SODIUM-DEXTROSE 2-4 GM/100ML-% IV SOLN
2.0000 g | INTRAVENOUS | Status: AC
  Administered 2023-10-16: 2 g via INTRAVENOUS
  Filled 2023-10-16: qty 100

## 2023-10-16 MED ORDER — FENTANYL CITRATE (PF) 100 MCG/2ML IJ SOLN: 25.0000 ug | INTRAMUSCULAR | Status: DC | PRN

## 2023-10-16 MED ORDER — 0.9 % SODIUM CHLORIDE (POUR BTL) OPTIME
TOPICAL | Status: DC | PRN
  Administered 2023-10-16: 1000 mL

## 2023-10-16 MED ORDER — FENTANYL CITRATE (PF) 250 MCG/5ML IJ SOLN
INTRAMUSCULAR | Status: DC | PRN
  Administered 2023-10-16: 50 ug via INTRAVENOUS
  Administered 2023-10-16: 150 ug via INTRAVENOUS
  Administered 2023-10-16: 50 ug via INTRAVENOUS

## 2023-10-16 MED ORDER — SODIUM CHLORIDE 0.9% IV SOLUTION: Freq: Once | INTRAVENOUS | Status: DC

## 2023-10-16 SURGICAL SUPPLY — 63 items
BLADE SURG 11 STRL SS (BLADE) ×1 IMPLANT
CANISTER SUCTION 3000ML PPV (SUCTIONS) ×2 IMPLANT
CANNULA REDUCER 12-8 DVNC XI (CANNULA) ×2 IMPLANT
CHLORAPREP W/TINT 26 (MISCELLANEOUS) ×1 IMPLANT
CNTNR URN SCR LID CUP LEK RST (MISCELLANEOUS) ×5 IMPLANT
DEFOGGER SCOPE WARM SEASHARP (MISCELLANEOUS) ×1 IMPLANT
DERMABOND ADVANCED .7 DNX12 (GAUZE/BANDAGES/DRESSINGS) ×1 IMPLANT
DRAPE ARM DVNC X/XI (DISPOSABLE) ×4 IMPLANT
DRAPE COLUMN DVNC XI (DISPOSABLE) ×1 IMPLANT
DRAPE CV SPLIT W-CLR ANES SCRN (DRAPES) ×1 IMPLANT
DRAPE SURG ORHT 6 SPLT 77X108 (DRAPES) ×1 IMPLANT
ELECT BLADE 6.5 EXT (BLADE) IMPLANT
ELECTRODE REM PT RTRN 9FT ADLT (ELECTROSURGICAL) ×1 IMPLANT
FORCEPS BPLR LNG DVNC XI (INSTRUMENTS) IMPLANT
FORCEPS CADIERE DVNC XI (FORCEP) IMPLANT
GAUZE KITTNER 4X5 RF (MISCELLANEOUS) IMPLANT
GAUZE KITTNER 4X8 (MISCELLANEOUS) ×1 IMPLANT
GAUZE SPONGE 4X4 12PLY STRL (GAUZE/BANDAGES/DRESSINGS) ×1 IMPLANT
GAUZE SPONGE 4X4 12PLY STRL LF (GAUZE/BANDAGES/DRESSINGS) IMPLANT
GLOVE BIO SURGEON STRL SZ7.5 (GLOVE) ×2 IMPLANT
GLOVE SURG POLYISO LF SZ8 (GLOVE) ×1 IMPLANT
GOWN STRL REUS W/ TWL LRG LVL3 (GOWN DISPOSABLE) ×2 IMPLANT
GOWN STRL REUS W/ TWL XL LVL3 (GOWN DISPOSABLE) ×2 IMPLANT
GOWN STRL REUS W/TWL 2XL LVL3 (GOWN DISPOSABLE) ×1 IMPLANT
GRASPER TIP-UP FEN DVNC XI (INSTRUMENTS) IMPLANT
HEMOSTAT SURGICEL 2X14 (HEMOSTASIS) ×1 IMPLANT
IRRIGATION STRYKERFLOW (MISCELLANEOUS) IMPLANT
KIT BASIN OR (CUSTOM PROCEDURE TRAY) ×1 IMPLANT
KIT TURNOVER KIT B (KITS) ×1 IMPLANT
NDL 22X1.5 STRL (OR ONLY) (MISCELLANEOUS) ×1 IMPLANT
NEEDLE 22X1.5 STRL (OR ONLY) (MISCELLANEOUS) ×1 IMPLANT
NS IRRIG 1000ML POUR BTL (IV SOLUTION) ×3 IMPLANT
PACK CHEST (CUSTOM PROCEDURE TRAY) ×1 IMPLANT
PAD ARMBOARD POSITIONER FOAM (MISCELLANEOUS) ×5 IMPLANT
RELOAD STAPLE 45 2.0 GRY DVNC (STAPLE) IMPLANT
RELOAD STAPLE 45 2.5 WHT DVNC (STAPLE) IMPLANT
RELOAD STAPLE 45 3.5 BLU DVNC (STAPLE) IMPLANT
RELOAD STAPLE 45 4.3 GRN DVNC (STAPLE) IMPLANT
RELOAD STAPLER 2.5X45 WHT DVNC (STAPLE) ×2 IMPLANT
RELOAD STAPLER 3.5X45 BLU DVNC (STAPLE) ×6 IMPLANT
RELOAD STAPLER 4.3X45 GRN DVNC (STAPLE) ×2 IMPLANT
SEAL UNIV 5-12 XI (MISCELLANEOUS) ×4 IMPLANT
SET TRI-LUMEN FLTR TB AIRSEAL (TUBING) ×1 IMPLANT
SOLUTION ELECTROSURG ANTI STCK (MISCELLANEOUS) IMPLANT
STAPLE RELOAD 45 2.0 GRAY DVNC (STAPLE) ×1 IMPLANT
STAPLER 45 SUREFORM CVD DVNC (STAPLE) IMPLANT
STOPCOCK 4 WAY LG BORE MALE ST (IV SETS) ×1 IMPLANT
SUT MON AB 2-0 CT1 36 (SUTURE) IMPLANT
SUT SILK 1 MH (SUTURE) ×1 IMPLANT
SUT VIC AB 1 CTX36XBRD ANBCTR (SUTURE) IMPLANT
SUT VIC AB 2-0 CT1 TAPERPNT 27 (SUTURE) ×1 IMPLANT
SUT VIC AB 3-0 SH 27X BRD (SUTURE) ×2 IMPLANT
SUT VICRYL 0 TIES 12 18 (SUTURE) ×1 IMPLANT
SUT VICRYL 0 UR6 27IN ABS (SUTURE) ×2 IMPLANT
SYR 10ML LL (SYRINGE) ×1 IMPLANT
SYR 20ML LL LF (SYRINGE) ×1 IMPLANT
SYR 50ML LL SCALE MARK (SYRINGE) ×1 IMPLANT
SYSTEM RETRIEVAL ANCHOR 15 (MISCELLANEOUS) IMPLANT
SYSTEM SAHARA CHEST DRAIN ATS (WOUND CARE) ×1 IMPLANT
TAPE CLOTH 4X10 WHT NS (GAUZE/BANDAGES/DRESSINGS) ×1 IMPLANT
TOWEL GREEN STERILE (TOWEL DISPOSABLE) ×1 IMPLANT
TRAY FOLEY MTR SLVR 16FR STAT (SET/KITS/TRAYS/PACK) ×1 IMPLANT
TUBING EXTENTION W/L.L. (IV SETS) ×1 IMPLANT

## 2023-10-16 NOTE — Hospital Course (Addendum)
 History of Present Illness:  Omar Hill is an 73 yo AA male with known history of CAD.  He recently underwent a calcium scoring CT scan during which he was found to have a 1.4 cm pulmonary nodule in his right lower lobe.  He was subsequently referred to Pulmonary and evaluated by Dr. Darnelle Elders who recommended further workup with PET CT and Robotic Assisted Navigational biopsy for diagnosis.  PET CT scan revealed the nodule to be hypermetabolic.  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  Due to this it was recommended the patient undergo surgical resection.  He was referred to Dr. Deloise Ferries who recommended the patient undergo Robotic Assisted Left Lower Lobectomy.  He received cardiac clearance, the risks and benefits of the procedure were explained to the patient and he was agreeable to proceed.  Hospital Course:  Omar Hill presented to [facility] on 10/16/2023.  He underwent Robotic Assisted Right Video Thoracoscopy with Right Lower Lobectomy, Lymph Node Dissection, Intercostal Nerve Block.  He tolerated the procedure without difficulty, was extubated, and taken to the PACU in stable condition.

## 2023-10-16 NOTE — Plan of Care (Signed)
  Problem: Education: Goal: Knowledge of General Education information will improve Description: Including pain rating scale, medication(s)/side effects and non-pharmacologic comfort measures Outcome: Progressing   Problem: Clinical Measurements: Goal: Respiratory complications will improve Outcome: Progressing Goal: Cardiovascular complication will be avoided Outcome: Progressing   Problem: Nutrition: Goal: Adequate nutrition will be maintained Outcome: Progressing   Problem: Pain Managment: Goal: General experience of comfort will improve and/or be controlled Outcome: Progressing

## 2023-10-16 NOTE — Transfer of Care (Signed)
 Immediate Anesthesia Transfer of Care Note  Patient: Azucena Bollard Chabot  Procedure(s) Performed: LOBECTOMY, LUNG, ROBOT-ASSISTED, USING VATS (Right: Chest) LYMPH NODE BIOPSY (Right: Chest) BLOCK, NERVE, INTERCOSTAL (Right: Chest)  Patient Location: PACU  Anesthesia Type:General  Level of Consciousness: awake and drowsy  Airway & Oxygen Therapy: Patient Spontanous Breathing and Patient connected to face mask oxygen  Post-op Assessment: Report given to RN and Post -op Vital signs reviewed and stable  Post vital signs: Reviewed and stable  Last Vitals:  Vitals Value Taken Time  BP 142/87 10/16/23 1542  Temp    Pulse 74 10/16/23 1545  Resp 12 10/16/23 1545  SpO2 100 % 10/16/23 1545  Vitals shown include unfiled device data.  Last Pain:  Vitals:   10/16/23 1109  TempSrc: Oral         Complications: No notable events documented.

## 2023-10-16 NOTE — Interval H&P Note (Signed)
 History and Physical Interval Note:  10/16/2023 12:09 PM  Omar Hill  has presented today for surgery, with the diagnosis of RLL NON-SMALL CELL LUNG CANCER.  The various methods of treatment have been discussed with the patient and family. After consideration of risks, benefits and other options for treatment, the patient has consented to  Procedure(s) with comments: LOBECTOMY, LUNG, ROBOT-ASSISTED, USING VATS (Right) - RIGHT ROBOTIC RIGHT LOWER LOBECTOMY as a surgical intervention.  The patient's history has been reviewed, patient examined, no change in status, stable for surgery.  I have reviewed the patient's chart and labs.  Questions were answered to the patient's satisfaction.     [name]

## 2023-10-16 NOTE — Anesthesia Procedure Notes (Signed)
 Procedure Name: Intubation Date/Time: 10/16/2023 12:59 PM  Performed by: Alphia Jasmine, CRNAPre-anesthesia Checklist: Patient identified, Emergency Drugs available, Suction available, Timeout performed and Patient being monitored Patient Re-evaluated:Patient Re-evaluated prior to induction Oxygen Delivery Method: Circle system utilized Preoxygenation: Pre-oxygenation with 100% oxygen Induction Type: IV induction Ventilation: Mask ventilation without difficulty Laryngoscope Size: Mac and 4 Grade View: Grade II Tube type: Oral Endobronchial tube: Left, Double lumen EBT and EBT position confirmed by auscultation and 39 Fr Number of attempts: 2 Airway Equipment and Method: Stylet Placement Confirmation: ETT inserted through vocal cords under direct vision, positive ETCO2, CO2 detector and breath sounds checked- equal and bilateral Secured at: 30 cm Tube secured with: Tape Dental Injury: Teeth and Oropharynx as per pre-operative assessment

## 2023-10-16 NOTE — Anesthesia Procedure Notes (Addendum)
 Arterial Line Insertion Start/End6/01/2024 12:00 PM, 10/16/2023 12:05 PM Performed by: Leslye Rast, MD, Alphia Jasmine, CRNA, anesthesiologist  Patient location: Pre-op. Preanesthetic checklist: patient identified, IV checked, site marked, risks and benefits discussed, surgical consent, monitors and equipment checked, pre-op evaluation, timeout performed and anesthesia consent Lidocaine  1% used for infiltration radial was placed Catheter size: 20 G Hand hygiene performed  and maximum sterile barriers used   Attempts: 1 Procedure performed using ultrasound guided technique. Ultrasound Notes:anatomy identified, needle tip was noted to be adjacent to the nerve/plexus identified and no ultrasound evidence of intravascular and/or intraneural injection Following insertion, dressing applied and Biopatch. Post procedure assessment: normal and unchanged  Patient tolerated the procedure well with no immediate complications.

## 2023-10-16 NOTE — Brief Op Note (Signed)
 10/16/2023  3:23 PM  PATIENT:  Sammie Crigler A Olenik  73 y.o. male  PRE-OPERATIVE DIAGNOSIS:  RIGHT LOWER LOBE NON-SMALL CELL LUNG CANCER  POST-OPERATIVE DIAGNOSIS:  RIGHT LOWER LOBE NON-SMALL CELL LUNG CANCER  PROCEDURE:  Procedure(s) with comments:   RIGHT ROBOTIC RIGHT LOWER LOBECTOMY LYMPH NODE BIOPSY (Right) BLOCK, NERVE, INTERCOSTAL (Right)  SURGEON:  Surgeons and Role:    * Lightfoot, Marinell Siad, MD - Primary  PHYSICIAN ASSISTANT: Gates Kasal PA-C, Eddie White PA-Student  ASSISTANTS: none   ANESTHESIA:   general  EBL:  20 mL   BLOOD ADMINISTERED:none  DRAINS: 28 Blake Drain   LOCAL MEDICATIONS USED:  BUPIVICAINE   SPECIMEN:  Source of Specimen:  Lymph Node, Right Lower Lobe  DISPOSITION OF SPECIMEN:  PATHOLOGY  COUNTS:  YES  TOURNIQUET:  * No tourniquets in log *  DICTATION: .Dragon Dictation  PLAN OF CARE: Admit to inpatient   PATIENT DISPOSITION:  PACU - hemodynamically stable.   Delay start of Pharmacological VTE agent (>24hrs) due to surgical blood loss or risk of bleeding: no

## 2023-10-16 NOTE — Discharge Summary (Signed)
 Physician Discharge Summary  Patient ID: Omar Hill MRN: [id number] DOB/AGE: Mar 26, 1951 73 y.o.  Admit date: 10/16/2023 Discharge date: 10/16/2023  Admission Diagnoses:  Patient Active Problem List   Diagnosis Date Noted   Heart failure with mildly reduced ejection fraction (HFmrEF, 41-49%) (HCC) 09/27/2023   CAD (coronary artery disease) 09/27/2023   Lung nodule 08/30/2023   Discharge Diagnoses:  Patient Active Problem List   Diagnosis Date Noted   Status post lobectomy of lung 10/16/2023   Heart failure with mildly reduced ejection fraction (HFmrEF, 41-49%) (HCC) 09/27/2023   CAD (coronary artery disease) 09/27/2023   Lung nodule 08/30/2023     Discharged Condition: {condition:18240}  History of Present Illness:  Omar Hill is an 73 yo AA male with known history of CAD.  He recently underwent a calcium scoring CT scan during which he was found to have a 1.4 cm pulmonary nodule in his right lower lobe.  He was subsequently referred to Pulmonary and evaluated by Dr. Darnelle Elders who recommended further workup with PET CT and Robotic Assisted Navigational biopsy for diagnosis.  PET CT scan revealed the nodule to be hypermetabolic.  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  Due to this it was recommended the patient undergo surgical resection.  He was referred to Dr. Deloise Ferries who recommended the patient undergo Robotic Assisted Left Lower Lobectomy.  He received cardiac clearance, the risks and benefits of the procedure were explained to the patient and he was agreeable to proceed.  Hospital Course:  Omar Hill presented to [facility] on 10/16/2023.  He underwent Robotic Assisted Right Video Thoracoscopy with Right Lower Lobectomy, Lymph Node Dissection, Intercostal Nerve Block.  He tolerated the procedure without difficulty, was extubated, and taken to the PACU in stable condition.  Consults: {consultation:18241}  Significant Diagnostic Studies: nuclear  medicine:   EXAM: NUCLEAR MEDICINE PET SKULL BASE TO THIGH   TECHNIQUE: 9.37 mCi F-18 FDG was injected intravenously. Full-ring PET imaging was performed from the skull base to thigh after the radiotracer. CT data was obtained and used for attenuation correction and anatomic localization.   Fasting blood glucose: 79 mg/dl   Multidetector CT imaging of the chest was performed following the standard protocol without IV contrast. Multiplanar and three-dimensional reformatted images were generated from original data set.   RADIATION DOSE REDUCTION: This exam was performed according to the departmental dose-optimization program which includes automated exposure control, adjustment of the mA and/or kV according to patient size and/or use of iterative reconstruction technique.   COMPARISON:  CT 08/09/2023.   FINDINGS: Mediastinal blood pool activity: SUV max 2.5   Liver activity: SUV max 2.9   NECK: No specific abnormal radiotracer uptake in the neck including along lymph node change of the submandibular, posterior triangle or internal jugular region. Near symmetric uptake along the visualized intracranial compartment.   Incidental CT findings: Visualized paranasal sinuses and mastoid air cells are clear. Parotid glands, submandibular glands are unremarkable. Small thyroid  gland. Mild vascular calcifications.   CHEST: No specific abnormal radiotracer uptake above mediastinal blood pool in the axillary regions, hilum or mediastinum. However there is some uptake identified along the spiculated lesion in the anterior right lower lobe. Maximum SUV value of 3.7. No additional areas of abnormal uptake along the lung parenchyma.   Incidental CT findings: Heart is nonenlarged. Trace pericardial effusion. Coronary calcifications are seen. Thoracic aorta is normal course and caliber with some mild calcified plaque.   Normal caliber thoracic esophagus. Small thyroid  gland. On this  non IV contrast exam no specific abnormal lymph node enlargement seen in the axillary region, hilum or mediastinum.   There is some linear opacity lung bases likely scar or atelectasis. No consolidation, pneumothorax or effusion. The spiculated nodule seen in the anterior right lower lobe has some marginal ground-glass. This abuts the major fissure with some retraction. The central component of the nodule which is the most solid or dense component again measures proximally 17 x 16 mm on series 5, image 112. Small satellite semi-solid nodule posterolateral measures 7 mm at the same image.   ABDOMEN/PELVIS: There is physiologic distribution radiotracer along the parenchymal organs, bowel and renal collecting systems. Very large prostate. There is some patchy uptake along the peripheral zone of the prostate posteriorly with maximum SUV approaching 50. Mild stranding. Please correlate with symptoms.   Incidental CT findings: Left hepatic lobe benign-appearing cystic foci identified. Adrenal glands are preserved. Gallbladder is nondilated. Spleen is nonenlarged. Grossly preserved pancreatic parenchyma. Motion artifact. Small nonobstructing lower pole left-sided renal stones. No right renal stones. No ureteral stones identified at this time. No bowel obstruction. Scattered colonic stool. Normal appendix.   SKELETON: No specific abnormal uptake along the visualized skeleton.   Incidental CT findings: Scattered degenerative changes along the spine and pelvis. Trace anterolisthesis of L4-5 and L5-S1.   IMPRESSION: Spiculated nodule along the right lower lobe is again seen and has mild abnormal radiotracer uptake of maximum SUV of 3.7. Malignant lesion is possible. Recommend further workup.   No additional areas of abnormal uptake to suggest metastatic disease.   There is a very large prostate with some significant uptake posteriorly along the prostate with some adjacent stranding.  Please correlate for any known history including the patient's PSA. Further workup such as prostate MRI as clinically appropriate     Electronically Signed   By: Adrianna Horde M.D.   On: 09/06/2023 13:59  Pathology from Bronchoscopy:  MRN: [id number]  Physician: Vergia Glasgow  DOB/Age [birth year] (Age: 51) Gender: M  Collected Date: 09/12/2023  Received Date: 09/12/2023   FINAL DIAGNOSIS        1. Lung, biopsy, RLL nodule :       -  ADENOCARCINOMA.       NOTE: DR. PATRICK HAS PEER-REVIEWED THE CASE AND AGREES WITH THE DIAGNOSIS.  A       SECURE CHAT MESSAGE WAS SENT TO DR. DGAYLI ON 09/14/23.   Treatments: surgery:   PATHOLOGY:  Discharge Exam: Blood pressure (!) 167/92, pulse (!) 58, temperature 98.2 F (36.8 C), temperature source Oral, resp. rate 18, height 6' (1.829 m), weight 83.9 kg, SpO2 98%. {physical WUJW:1191478}  Disposition:    Allergies as of 10/16/2023       Reactions   Pollen Extract Cough   Benadryl [diphenhydramine] Other (See Comments)   Could not urinate-caused prostate issues   Mucinex Dm [dm-guaifenesin Er]    Could not urinate-caused prostate issues     Med Rec must be completed prior to using this SMARTLINK***        Signed: Gates Kasal, PA-C  10/16/2023, 3:29 PM

## 2023-10-16 NOTE — Plan of Care (Signed)

## 2023-10-17 ENCOUNTER — Encounter (HOSPITAL_COMMUNITY): Payer: Self-pay | Admitting: Thoracic Surgery (Cardiothoracic Vascular Surgery)

## 2023-10-17 ENCOUNTER — Inpatient Hospital Stay (HOSPITAL_COMMUNITY)

## 2023-10-17 LAB — BASIC METABOLIC PANEL WITH GFR
Anion gap: 7 (ref 5–15)
BUN: 13 mg/dL (ref 8–23)
CO2: 23 mmol/L (ref 22–32)
Calcium: 8.5 mg/dL — ABNORMAL LOW (ref 8.9–10.3)
Chloride: 105 mmol/L (ref 98–111)
Creatinine, Ser: 1.32 mg/dL — ABNORMAL HIGH (ref 0.61–1.24)
GFR, Estimated: 57 mL/min — ABNORMAL LOW (ref >60)
Glucose, Bld: 147 mg/dL — ABNORMAL HIGH (ref 70–99)
Potassium: 4.3 mmol/L (ref 3.5–5.1)
Sodium: 135 mmol/L (ref 135–145)

## 2023-10-17 LAB — CBC
HCT: 38.2 % — ABNORMAL LOW (ref 39.0–52.0)
Hemoglobin: 12.5 g/dL — ABNORMAL LOW (ref 13.0–17.0)
MCH: 27.1 pg (ref 26.0–34.0)
MCHC: 32.7 g/dL (ref 30.0–36.0)
MCV: 82.7 fL (ref 80.0–100.0)
Platelets: 221 10*3/uL (ref 150–400)
RBC: 4.62 MIL/uL (ref 4.22–5.81)
RDW: 15.2 % (ref 11.5–15.5)
WBC: 14.2 10*3/uL — ABNORMAL HIGH (ref 4.0–10.5)
nRBC: 0 % (ref 0.0–0.2)

## 2023-10-17 MED ORDER — SACUBITRIL-VALSARTAN 24-26 MG PO TABS
1.0000 | ORAL_TABLET | Freq: Two times a day (BID) | ORAL | Status: DC
  Administered 2023-10-17 (×2): 1 via ORAL
  Filled 2023-10-17 (×2): qty 1

## 2023-10-17 NOTE — Progress Notes (Addendum)
      [address]       [place] [address]             [phone]       1 Day Post-Op Procedure(s) (LRB): LOBECTOMY, LUNG, ROBOT-ASSISTED, USING VATS (Right) LYMPH NODE BIOPSY (Right) BLOCK, NERVE, INTERCOSTAL (Right)  Subjective:  Patient sitting up in chair.  Looks great, states he has minimal pain.  Denies N/V.  Objective: Vital signs in last 24 hours: Temp:  [97.8 F (36.6 C)-98.4 F (36.9 C)] 98.4 F (36.9 C) (06/10 0732) Pulse Rate:  [58-93] 85 (06/10 0732) Cardiac Rhythm: Heart block;Bundle branch block (06/10 0700) Resp:  [12-20] 16 (06/10 0732) BP: (124-167)/(74-93) 124/81 (06/10 0732) SpO2:  [94 %-100 %] 98 % (06/10 0732) Weight:  [83.9 kg] 83.9 kg (06/09 1109)  Intake/Output from previous day: 06/09 0701 - 06/10 0700 In: 1600 [I.V.:1500; IV Piggyback:100] Out: 870 [Urine:720; Blood:20; Chest Tube:130] Intake/Output this shift: Total I/O In: 120 [P.O.:120] Out: -   General appearance: alert, cooperative, and no distress Heart: regular rate and rhythm Lungs: clear to auscultation bilaterally Abdomen: soft, non-tender; bowel sounds normal; no masses,  no organomegaly Extremities: extremities normal, atraumatic, no cyanosis or edema Wound: clean and dry  Lab Results: Recent Labs    10/17/23 0223  WBC 14.2*  HGB 12.5*  HCT 38.2*  PLT 221   BMET:  Recent Labs    10/17/23 0223  NA 135  K 4.3  CL 105  CO2 23  GLUCOSE 147*  BUN 13  CREATININE 1.32*  CALCIUM 8.5*    PT/INR: No results for input(s): "LABPROT", "INR" in the last 72 hours. ABG    Component Value Date/Time   HCO3 29.2 (H) 05/03/2007 0100   TCO2 29 07/25/2009 0101   CBG (last 3)  No results for input(s): "GLUCAP" in the last 72 hours.  Assessment/Plan: S/P Procedure(s) (LRB): LOBECTOMY, LUNG, ROBOT-ASSISTED, USING VATS (Right) LYMPH NODE BIOPSY (Right) BLOCK, NERVE, INTERCOSTAL (Right)  CV- NSR, H/O CAD, HFmrEF, HTN- continue Aldactone , resume  Entresto  at half home dose if BP tolerates can increase to home dose tomorrow Pulm- CT on water seal, 130 cc... + air leak.. CXR with trace apical space Renal- baseline creatinine 1.32 on admission, remains stable ID- + leukocytosis.Aaron Aas likely SIRS, monitor Lovenox for DVT prophylaxis Dispo- CT on water seal with air leak... management per Dr. Deloise Ferries   LOS: 1 day   Gates Kasal, PA-C 10/17/2023   Small leak Continue waterseal   Skarlette Lattner Ala Alice

## 2023-10-17 NOTE — Progress Notes (Signed)
 Mobility Specialist Progress Note;   10/17/23 0938  Mobility  Activity Ambulated with assistance in hallway  Level of Assistance Standby assist, set-up cues, supervision of patient - no hands on  Assistive Device None  Distance Ambulated (ft) 400 ft  Activity Response Tolerated well  Mobility Referral Yes  Mobility visit 1 Mobility  Mobility Specialist Start Time (ACUTE ONLY) [id number]  Mobility Specialist Stop Time (ACUTE ONLY) 0944  Mobility Specialist Time Calculation (min) (ACUTE ONLY) 6 min   RN requesting pt to ambulate, pt agreeable. Required no physical assistance during ambulation, SV. VSS on RA. Asx during session w/ no c/o. Pt returned back to bed with all needs met, call bell in reach. RN in room.   Janit Meline Mobility Specialist Please contact via SecureChat or [employer] [phone]

## 2023-10-17 NOTE — Plan of Care (Signed)

## 2023-10-17 NOTE — Op Note (Signed)
      [address]       [place] [address]             [phone]        10/17/2023  Patient:  Omar Hill Pre-Op Dx: Right lower lobe NSCLC   Post-op Dx:  same Procedure: - Robotic assisted right video thoracoscopy - Right lower lobectomy - Mediastinal lymph node sampling - Intercostal nerve block  Surgeon and Role:      * Herley Bernardini, Marinell Siad, MD - Primary  Assistant: Wilburt Hands, PA-C  An experienced assistant was required given the complexity of this surgery and the standard of surgical care. The assistant was needed for exposure, dissection, suctioning, retraction of delicate tissues and sutures, instrument exchange and for overall help during this procedure.    Anesthesia  general EBL:  100 ml Blood Administration: none Specimen:  right lower, hilar and mediastinal nodes  Drains: 28 F argyle chest tube in right chest Counts: correct   Indications: 73 y.o. male with 1.7cm Right lower lobe NSCLC.  His pulmonary functions are acceptable.  He underwent a stress test in February of this year which was intermediate risk.  He subsequently underwent a LHC which showed LAD, and OM disease, but given that he was asymptomatic, medical therapy was recommended. He has been cleared for surgery.  The risks and benefits of right RATS, RLLectomy were discussed in detail.  The patient is is agreeable to proceed.  Findings: Normal anatomy  Operative Technique: After the risks, benefits and alternatives were thoroughly discussed, the patient was brought to the operative theatre.  Anesthesia was induced, and the patient was then placed in a lateral decubitus position and was prepped and draped in normal sterile fashion.  An appropriate surgical pause was performed, and pre-operative antibiotics were dosed accordingly.  We began by placing our 4 robotic ports in the the 7th intercostal space targeting the hilum of the lung.  A 12mm assistant port was placed in the 9th  intercostal space in the anterior axillary line.  The robot was then docked and all instruments were passed under direct visualization.    The lung was then retracted superiorly, and the inferior pulmonary ligament was divided.  The hilum was mobilized anteriorly and posteriorly.  We identified the lower lobe pulmonary vein, and after careful isolation, it was divided with a vascular stapler.  We next moved to the pulmonary artery.  The artery was then divided with a vascular load stapler.  The bronchus to the lower lobe was then isolated.  After a test clamp, with good ventilation of the remaining lung, the bronchus was then divided.  The fissure was completed, and the specimen was passed into an endocatch bag.  It was removed from the anterior access site.    Lymph nodes were then sampled at hilum and mediastinum.  The chest was irrigated, and an air leak test was performed.  An intercostal nerve block was performed under direct visualization.  A 28 F chest tube was then placed, and we watch the remaining lobes re-expand.  The skin and soft tissue were closed with absorbable suture    The patient tolerated the procedure without any immediate complications, and was transferred to the PACU in stable condition.  [name]

## 2023-10-17 NOTE — Anesthesia Postprocedure Evaluation (Signed)
 Anesthesia Post Note  Patient: Omar Hill  Procedure(s) Performed: LOBECTOMY, LUNG, ROBOT-ASSISTED, USING VATS (Right: Chest) LYMPH NODE BIOPSY (Right: Chest) BLOCK, NERVE, INTERCOSTAL (Right: Chest)     Patient location during evaluation: PACU Anesthesia Type: General Level of consciousness: patient cooperative Pain management: pain level controlled Vital Signs Assessment: post-procedure vital signs reviewed and stable Respiratory status: spontaneous breathing, nonlabored ventilation, respiratory function stable and patient connected to nasal cannula oxygen Cardiovascular status: blood pressure returned to baseline and stable Postop Assessment: no apparent nausea or vomiting Anesthetic complications: no   No notable events documented.                Clenton Esper

## 2023-10-17 NOTE — TOC CM/SW Note (Signed)
 Transition of Care [facility]) - Inpatient Brief Assessment   Patient Details  Name: Omar Hill MRN: [id number] Date of Birth: Jul 04, 1950  [facility] CM/SW Contact:    Juliane Och, LCSW Phone Number: 10/17/2023, 8:52 AM   Clinical Narrative:  8:52 AM Per chart review, patient resides at home with spouse. Patient has a PCP and insurance. Patient does not have SNF/HH/DME history. Patient's preferred pharmacy is [facility] [phone] [place]. No TOC needs were identified at this time. TOC will continue to follow and be available to assist.  Transition of Care Asessment: Insurance and Status: Insurance coverage has been reviewed Patient has primary care physician: Yes Home environment has been reviewed: Private Residence Prior level of function:: N/A Prior/Current Home Services: No current home services Social Drivers of Health Review: SDOH reviewed no interventions necessary Readmission risk has been reviewed: Yes Transition of care needs: no transition of care needs at this time

## 2023-10-17 NOTE — Plan of Care (Signed)
  Problem: Education: Goal: Knowledge of General Education information will improve Description: Including pain rating scale, medication(s)/side effects and non-pharmacologic comfort measures Outcome: Progressing   Problem: Health Behavior/Discharge Planning: Goal: Ability to manage health-related needs will improve Outcome: Progressing   Problem: Clinical Measurements: Goal: Ability to maintain clinical measurements within normal limits will improve Outcome: Progressing Goal: Will remain free from infection Outcome: Progressing Goal: Diagnostic test results will improve Outcome: Progressing Goal: Respiratory complications will improve Outcome: Progressing Goal: Cardiovascular complication will be avoided Outcome: Progressing   Problem: Activity: Goal: Risk for activity intolerance will decrease Outcome: Progressing   Problem: Nutrition: Goal: Adequate nutrition will be maintained Outcome: Progressing   Problem: Coping: Goal: Level of anxiety will decrease Outcome: Progressing   Problem: Elimination: Goal: Will not experience complications related to bowel motility Outcome: Progressing Goal: Will not experience complications related to urinary retention Outcome: Progressing   Problem: Pain Managment: Goal: General experience of comfort will improve and/or be controlled Outcome: Progressing   Problem: Safety: Goal: Ability to remain free from injury will improve Outcome: Progressing   Problem: Education: Goal: Knowledge of disease or condition will improve Outcome: Progressing   Problem: Activity: Goal: Risk for activity intolerance will decrease Outcome: Progressing   Problem: Clinical Measurements: Goal: Postoperative complications will be avoided or minimized Outcome: Progressing   Problem: Respiratory: Goal: Respiratory status will improve Outcome: Progressing   Problem: Pain Management: Goal: Pain level will decrease Outcome: Progressing

## 2023-10-18 ENCOUNTER — Inpatient Hospital Stay (HOSPITAL_COMMUNITY)

## 2023-10-18 LAB — COMPREHENSIVE METABOLIC PANEL WITH GFR
ALT: 13 U/L (ref 0–44)
AST: 28 U/L (ref 15–41)
Albumin: 2.9 g/dL — ABNORMAL LOW (ref 3.5–5.0)
Alkaline Phosphatase: 41 U/L (ref 38–126)
Anion gap: 6 (ref 5–15)
BUN: 10 mg/dL (ref 8–23)
CO2: 27 mmol/L (ref 22–32)
Calcium: 8.1 mg/dL — ABNORMAL LOW (ref 8.9–10.3)
Chloride: 103 mmol/L (ref 98–111)
Creatinine, Ser: 1.13 mg/dL (ref 0.61–1.24)
GFR, Estimated: >60 mL/min (ref >60)
Glucose, Bld: 96 mg/dL (ref 70–99)
Potassium: 3.8 mmol/L (ref 3.5–5.1)
Sodium: 136 mmol/L (ref 135–145)
Total Bilirubin: 0.8 mg/dL (ref 0.0–1.2)
Total Protein: 6.1 g/dL — ABNORMAL LOW (ref 6.5–8.1)

## 2023-10-18 LAB — CBC
HCT: 37.8 % — ABNORMAL LOW (ref 39.0–52.0)
Hemoglobin: 12.5 g/dL — ABNORMAL LOW (ref 13.0–17.0)
MCH: 27.5 pg (ref 26.0–34.0)
MCHC: 33.1 g/dL (ref 30.0–36.0)
MCV: 83.1 fL (ref 80.0–100.0)
Platelets: 203 10*3/uL (ref 150–400)
RBC: 4.55 MIL/uL (ref 4.22–5.81)
RDW: 15.4 % (ref 11.5–15.5)
WBC: 12.6 10*3/uL — ABNORMAL HIGH (ref 4.0–10.5)
nRBC: 0 % (ref 0.0–0.2)

## 2023-10-18 MED ORDER — SACUBITRIL-VALSARTAN 49-51 MG PO TABS
1.0000 | ORAL_TABLET | Freq: Two times a day (BID) | ORAL | Status: DC
  Administered 2023-10-18 – 2023-10-19 (×3): 1 via ORAL
  Filled 2023-10-18 (×4): qty 1

## 2023-10-18 NOTE — Plan of Care (Signed)
  Problem: Education: Goal: Knowledge of General Education information will improve Description: Including pain rating scale, medication(s)/side effects and non-pharmacologic comfort measures Outcome: Progressing   Problem: Health Behavior/Discharge Planning: Goal: Ability to manage health-related needs will improve Outcome: Progressing   Problem: Clinical Measurements: Goal: Ability to maintain clinical measurements within normal limits will improve Outcome: Progressing Goal: Will remain free from infection Outcome: Progressing Goal: Diagnostic test results will improve Outcome: Progressing Goal: Respiratory complications will improve Outcome: Progressing Goal: Cardiovascular complication will be avoided Outcome: Progressing   Problem: Activity: Goal: Risk for activity intolerance will decrease Outcome: Progressing   Problem: Nutrition: Goal: Adequate nutrition will be maintained Outcome: Progressing   Problem: Coping: Goal: Level of anxiety will decrease Outcome: Progressing   Problem: Elimination: Goal: Will not experience complications related to bowel motility Outcome: Progressing Goal: Will not experience complications related to urinary retention Outcome: Progressing   Problem: Pain Managment: Goal: General experience of comfort will improve and/or be controlled Outcome: Progressing   Problem: Safety: Goal: Ability to remain free from injury will improve Outcome: Progressing   Problem: Skin Integrity: Goal: Risk for impaired skin integrity will decrease Outcome: Progressing   Problem: Activity: Goal: Risk for activity intolerance will decrease Outcome: Progressing   Problem: Cardiac: Goal: Will achieve and/or maintain hemodynamic stability Outcome: Progressing   Problem: Clinical Measurements: Goal: Postoperative complications will be avoided or minimized Outcome: Progressing   Problem: Respiratory: Goal: Respiratory status will improve Outcome:  Progressing   Problem: Pain Management: Goal: Pain level will decrease Outcome: Progressing   Problem: Skin Integrity: Goal: Wound healing without signs and symptoms infection will improve Outcome: Progressing

## 2023-10-18 NOTE — Plan of Care (Signed)
   Problem: Education: Goal: Knowledge of General Education information will improve Description Including pain rating scale, medication(s)/side effects and non-pharmacologic comfort measures Outcome: Progressing   Problem: Health Behavior/Discharge Planning: Goal: Ability to manage health-related needs will improve Outcome: Progressing

## 2023-10-18 NOTE — Progress Notes (Signed)
 Patient ambulated halls with no complaints, tolerated well.

## 2023-10-18 NOTE — Progress Notes (Signed)
      [address]       [place] [address]             [phone]      2 Days Post-Op Procedure(s) (LRB): LOBECTOMY, LUNG, ROBOT-ASSISTED, USING VATS (Right) LYMPH NODE BIOPSY (Right) BLOCK, NERVE, INTERCOSTAL (Right) Subjective: Sitting up in bed, awake and alert.  Had a good day yesterday, walk 400 feet in the hall.  Denies shortness of breath on room air.  Pain is controllable. Bowel movement yesterday.   Objective: Vital signs in last 24 hours: Temp:  [97.9 F (36.6 C)-98.7 F (37.1 C)] 98.2 F (36.8 C) (06/11 0724) Pulse Rate:  [63-100] 100 (06/11 0724) Cardiac Rhythm: Normal sinus rhythm (06/10 2000) Resp:  [15-18] 16 (06/11 0724) BP: (115-179)/(78-100) 152/94 (06/11 0724) SpO2:  [97 %-100 %] 97 % (06/11 0724)  Hemodynamic parameters for last 24 hours:    Intake/Output from previous day: 06/10 0701 - 06/11 0700 In: 1080 [P.O.:1080] Out: 1396 [Urine:1300; Chest Tube:96] Intake/Output this shift: No intake/output data recorded.  General appearance: alert, cooperative, and no distress Neurologic: intact Heart: Regular rate and rhythm, monitor shows normal sinus rhythm Lungs: Breath sounds are clear to auscultation.  Chest tube drainage less than 100 mL past 24 hours no airleak with spontaneous breathing but consistently has an air leak with cough.  Chest x-ray this morning showing both lungs well-expanded with clear lung fields Extremities: Well-perfused with no edema Wound: All port sites are intact and dry.  Chest tube is secured and has a dry dressing.  Lab Results: Recent Labs    10/17/23 0223 10/18/23 0320  WBC 14.2* 12.6*  HGB 12.5* 12.5*  HCT 38.2* 37.8*  PLT 221 203   BMET:  Recent Labs    10/17/23 0223 10/18/23 0320  NA 135 136  K 4.3 3.8  CL 105 103  CO2 23 27  GLUCOSE 147* 96  BUN 13 10  CREATININE 1.32* 1.13  CALCIUM 8.5* 8.1*    PT/INR: No results for input(s): LABPROT, INR in the last 72 hours. ABG     Component Value Date/Time   HCO3 29.2 (H) 05/03/2007 0100   TCO2 29 07/25/2009 0101   CBG (last 3)  No results for input(s): GLUCAP in the last 72 hours.  Assessment/Plan: S/P Procedure(s) (LRB): LOBECTOMY, LUNG, ROBOT-ASSISTED, USING VATS (Right) LYMPH NODE BIOPSY (Right) BLOCK, NERVE, INTERCOSTAL (Right)  - Postop day 2 robotic assisted right lower lobectomy for non-small cell lung cancer.  Progressing very well with stable respiratory status and independent mobility.  Functionally expanded on chest x-ray, still single small airleak cough.  Leave the chest tube waterseal for now.  -CV: History of hypertension, heart failure with reduced ejection fraction, and coronary artery disease: Started back on half dose of his Entresto  yesterday blood pressure is trending up.  Will advance to his usual 49/50 mg dosing.  Spironolactone  has been resumed.  He appears to be euvolemic on exam.  -Renal: Stage III CKD.  Creatinine 1.13 this morning.  Monitor  -History of hypothyroidism: Levothyroxine has been resumed.  -DVT PPx: Progressing well with mobility.  Continue enoxaparin daily.   LOS: 2 days    [name], PA-C 10/18/2023

## 2023-10-18 NOTE — Progress Notes (Signed)
 Pt refusing to walk at this time. Agreeable to walking halls after dinner, will try to ambulate with patient again after he eats.

## 2023-10-18 NOTE — Progress Notes (Signed)
 Mobility Specialist Progress Note:   10/18/23 0906  Mobility  Activity Ambulated with assistance in hallway;Ambulated with assistance in room;Ambulated with assistance to bathroom  Level of Assistance Standby assist, set-up cues, supervision of patient - no hands on  Assistive Device None  Distance Ambulated (ft) 400 ft  Activity Response Tolerated well  Mobility Referral Yes  Mobility visit 1 Mobility  Mobility Specialist Start Time (ACUTE ONLY) [id number]  Mobility Specialist Stop Time (ACUTE ONLY) 0913  Mobility Specialist Time Calculation (min) (ACUTE ONLY) 7 min   Pt received in bed, agreeable to mobility session. Ambulated in hallway, no AD required. Tolerated well, returned pt to room, left with all needs met.   [employer] Mobility Specialist Please contact via Special educational needs teacher or  Rehab office at [phone]

## 2023-10-19 ENCOUNTER — Inpatient Hospital Stay (HOSPITAL_COMMUNITY)

## 2023-10-19 ENCOUNTER — Other Ambulatory Visit (HOSPITAL_COMMUNITY): Payer: Self-pay

## 2023-10-19 LAB — SURGICAL PATHOLOGY

## 2023-10-19 MED ORDER — TRAMADOL HCL 50 MG PO TABS
50.0000 mg | ORAL_TABLET | Freq: Four times a day (QID) | ORAL | 0 refills | Status: DC | PRN
Start: 2023-10-19 — End: 2023-10-19

## 2023-10-19 MED ORDER — ACETAMINOPHEN 325 MG PO TABS: 650.0000 mg | ORAL_TABLET | Freq: Four times a day (QID) | ORAL | Status: AC | PRN

## 2023-10-19 MED ORDER — TRAMADOL HCL 50 MG PO TABS
50.0000 mg | ORAL_TABLET | Freq: Four times a day (QID) | ORAL | 0 refills | Status: AC | PRN
  Filled 2023-10-19: qty 28, 7d supply, fill #0

## 2023-10-19 NOTE — Plan of Care (Signed)
  Problem: Education: Goal: Knowledge of General Education information will improve Description: Including pain rating scale, medication(s)/side effects and non-pharmacologic comfort measures Outcome: Progressing   Problem: Health Behavior/Discharge Planning: Goal: Ability to manage health-related needs will improve Outcome: Progressing   Problem: Clinical Measurements: Goal: Ability to maintain clinical measurements within normal limits will improve Outcome: Progressing Goal: Will remain free from infection Outcome: Progressing Goal: Respiratory complications will improve Outcome: Progressing Goal: Cardiovascular complication will be avoided Outcome: Progressing   Problem: Nutrition: Goal: Adequate nutrition will be maintained Outcome: Progressing   Problem: Elimination: Goal: Will not experience complications related to bowel motility Outcome: Progressing Goal: Will not experience complications related to urinary retention Outcome: Progressing   Problem: Pain Managment: Goal: General experience of comfort will improve and/or be controlled Outcome: Progressing   Problem: Safety: Goal: Ability to remain free from injury will improve Outcome: Progressing   Problem: Skin Integrity: Goal: Risk for impaired skin integrity will decrease Outcome: Progressing

## 2023-10-19 NOTE — Progress Notes (Signed)
 Mobility Specialist Progress Note:    10/19/23 1000  Mobility  Activity Ambulated independently in hallway  Level of Assistance Standby assist, set-up cues, supervision of patient - no hands on  Assistive Device None  Distance Ambulated (ft) 400 ft  Activity Response Tolerated well  Mobility Referral Yes  Mobility visit 1 Mobility  Mobility Specialist Start Time (ACUTE ONLY) [id number]  Mobility Specialist Stop Time (ACUTE ONLY) 0935  Mobility Specialist Time Calculation (min) (ACUTE ONLY) 6 min   Received pt in bed having no complaints and agreeable to mobility. Pt was asymptomatic throughout ambulation and returned to room w/o fault. Left in bed w/ call bell in reach and all needs met.   [employer] Nolon Baxter Mobility Specialist Please contact via Special educational needs teacher or Rehab office at [phone]

## 2023-10-19 NOTE — Progress Notes (Signed)
 Explained discharge instructions to patient. Reviewed follow up appointment and next medication administration times. Also reviewed education. Patient verbalized having an understanding for instructions given. All belongings are in the patient's possession to include TOC meds. IV and telemetry were removed by the patient's RN. No other needs verbalized. Transported out for discharge.

## 2023-10-19 NOTE — Progress Notes (Signed)
 [address]       [place] [address]             [phone]      3 Days Post-Op Procedure(s) (LRB): LOBECTOMY, LUNG, ROBOT-ASSISTED, USING VATS (Right) LYMPH NODE BIOPSY (Right) BLOCK, NERVE, INTERCOSTAL (Right) Subjective: Sitting up in bed, awake and alert. No complaints or concerns.  Remains on RA.    Objective: Vital signs in last 24 hours: Temp:  [98 F (36.7 C)-98.8 F (37.1 C)] 98 F (36.7 C) (06/12 0728) Pulse Rate:  [67-86] 70 (06/12 0728) Cardiac Rhythm: Normal sinus rhythm (06/11 2006) Resp:  [14-17] 16 (06/12 0728) BP: (130-160)/(84-100) 130/87 (06/12 0728) SpO2:  [96 %-98 %] 97 % (06/12 0728)    Intake/Output from previous day: 06/11 0701 - 06/12 0700 In: -  Out: 440 [Urine:200; Chest Tube:240] Intake/Output this shift: Total I/O In: 240 [P.O.:240] Out: -   General appearance: alert, cooperative, and no distress Neurologic: intact Heart: Regular rate and rhythm, monitor shows normal sinus rhythm Lungs: Breath sounds are clear to auscultation.  Chest tube drainage minimal, no air leak today.  Chest x-ray this morning showing small apical PTX.  Extremities: Well-perfused with no edema Wound: All port sites are intact and dry.  Chest tube is secured and has a dry dressing.  Lab Results: Recent Labs    10/17/23 0223 10/18/23 0320  WBC 14.2* 12.6*  HGB 12.5* 12.5*  HCT 38.2* 37.8*  PLT 221 203   BMET:  Recent Labs    10/17/23 0223 10/18/23 0320  NA 135 136  K 4.3 3.8  CL 105 103  CO2 23 27  GLUCOSE 147* 96  BUN 13 10  CREATININE 1.32* 1.13  CALCIUM 8.5* 8.1*    PT/INR: No results for input(s): LABPROT, INR in the last 72 hours. ABG    Component Value Date/Time   HCO3 29.2 (H) 05/03/2007 0100   TCO2 29 07/25/2009 0101   CLINICAL DATA:  [id number].  Postoperative state.   EXAM: PORTABLE CHEST 1 VIEW   COMPARISON:  Portable chest yesterday at 5:34 a.m.   FINDINGS: 6:09 a.m. Interval new minimal right  apical pneumothorax, estimated 3% or less of the chest volume with stable positioning of the right chest tube.   There is band atelectasis in both bases with remaining lungs clear. Mild cardiomegaly.   No vascular congestion is seen. The mediastinum is stable. No new osseous finding.   IMPRESSION: 1. Interval new minimal right apical pneumothorax, estimated 3% or less of the chest volume with stable positioning of the right chest tube. 2. Bibasilar atelectasis. 3. Mild cardiomegaly. 4. These results will be called to the ordering clinician or representative by the Radiologist Assistant, and communication documented in the PACS or [employer].     Electronically Signed   By: Denman Fischer M.D.   On: 10/19/2023 07:59  Assessment/Plan: S/P Procedure(s) (LRB): LOBECTOMY, LUNG, ROBOT-ASSISTED, USING VATS (Right) LYMPH NODE BIOPSY (Right) BLOCK, NERVE, INTERCOSTAL (Right)    - Postop day 3 robotic assisted right lower lobectomy for non-small cell lung cancer.  Progressing very well with stable respiratory status and independent mobility.  Small right apical PTX today but no air leak observed. Clamp tril x 4 hours and repeat the CXR before removing the tube.  -CV: History of hypertension, heart failure with reduced ejection fraction, and coronary artery disease: On full dose Entresto .  He appears to be euvolemic on exam.  -Renal: Stage III CKD.  Creatinine 1.13 6/11.  -History of hypothyroidism: Levothyroxine has been resumed.  -DVT PPx: Progressing well with mobility.  Continue enoxaparin daily.  -Disposition: Clamp trial this morning and remove the chest tube if CXR stable. Possible discharge to home later this afternoon.    LOS: 3 days    [place] Siebels G. Ejay Lashley, PA-C 10/19/2023

## 2023-10-19 NOTE — Discharge Instructions (Signed)
 Discharge Instructions:  1. You may shower, please wash incisions daily with soap and water and keep dry.  If you wish to cover wounds with dressing you may do so but please keep clean and change daily.  No tub baths or swimming until incisions have completely healed.  If your incisions become red or develop any drainage please call our office at [phone]  2. No Driving until cleared by Dr. Raina Bunting office and you are no longer using narcotic pain medications.  3. You may remove the dressing from the chest tube site in 48 hours then leave open to air.    4. Fever of 101.5 for at least 24 hours with no source, please contact our office at [phone]  5. Activity- up as tolerated, please walk at least 3 times per day.  Avoid strenuous activity, no lifting, pushing, or pulling with your arms over 8-10 lbs for a minimum of 6 weeks  6. If any questions or concerns arise, please do not hesitate to contact our office at [phone]

## 2023-10-20 ENCOUNTER — Other Ambulatory Visit (HOSPITAL_COMMUNITY): Payer: Self-pay

## 2023-10-26 ENCOUNTER — Other Ambulatory Visit: Payer: Self-pay

## 2023-10-26 LAB — ACID FAST CULTURE WITH REFLEXED SENSITIVITIES (MYCOBACTERIA)
Acid Fast Culture: NEGATIVE
Acid Fast Culture: NEGATIVE

## 2023-10-28 NOTE — Progress Notes (Signed)
 The proposed treatment discussed in conference is for discussion purpose only and is not a binding recommendation.  The patients have not been physically examined, or presented with their treatment options.  Therefore, final treatment plans cannot be decided.

## 2023-10-29 ENCOUNTER — Encounter: Payer: Self-pay | Admitting: Cardiovascular Disease

## 2023-10-29 NOTE — Progress Notes (Unsigned)
 Cardiology Office Note:  .   Date:  10/30/2023  ID:  Elsie LABOR Katayama, DOB 08-Mar-1951, MRN [id number] PCP: Loreli Kins, MD  [facility] Providers Cardiologist:  None    History of Present Illness: .   Davian Hanshaw Turri is a 73 y.o. male with hx of HTN , HLD,  He had a coronary calcium  score  of 2170 ( 93rd percentile for age / sex matched controls )   No CP , no dyspenea  His Coronary CT scan was ordered by his primary  He was started on Rosuvastatin  when they received the results  He has HTN,   has been well controlled.  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  We discussed the PCSK9 inhibitors and also inclisiran.  He is not not inclined so much to give himself an injection.  We discussed the ease of getting inclisiran injections every 6 months.  Will explore that further.  I will see him in mid June for a follow-up appointment.  He has lipids that will be rechecked by his primary medical doctor later this month.   July 25, 2023 Marquiz is seen for follow up of his LBBB, HTL Seem with wife, Apolinar Southerly in Feb. 2025 shows mildly reduced LV systolic function with EF 40-45% - there is some dyssynchrony from the LBBB Grade I DD  Myoview  study shows a defect c/w anterior apical MI vs.changes from his LBBB  He returns for further discussion  We had long discussion about left bundle branch block and the abnormalities that it causes.  We had a discussion about heart catheterization.    We discussed the risk, benefits, options of heart catheterization.  He understands and agrees to  proceed.   He has some degree of chronic kidney disease.  His last creatinine is 1.46.  My plan is to eventually have him stop the amlodipine and chlorthalidone and start him on ARB or Entresto , spironolactone , possibly Lasix.  I do not want to start any of these medications before the cath because I do not want his creatinine to be affected at this time.  Will anticipate having a return to see an APP in 4 to 6 weeks and at that time we can transition him from amlodipine to an ARB or entresto    He will avoid salt and processed foods.   August 31, 2023 Seen with wife, Kohei Antonellis is seen for follow up of his CHF, The myoview  showed a fixed inf. Apical defect   Cath reveals severe diffuse CAC LAD :  80% mid LAD stenosis. The LAD wraps around the apex and supplies the inferior apical wall.  LCx:  mild disease RCA mild disease   He overall seems to be doing fairly well.  His creatinine has improved to 1.29.  Our plan for today is to discontinue the amlodipine and start him on Entresto  49-51 twice a day.  Will get a basic metabolic profile in 2 to 3 weeks.  Will have him see an  APP in approximately 6  weeks.  My plan is to stop chlorthalidone and start spironolactone  assuming that his renal function has remained stable.   October 30, 2023  Ghassan is seen for follow up of his LBBB and CHF  We started him on Entresto  at his last O/V  He was found to have a lung nodule. S/p lobectomy several weeks ago  by Dr. Shyrl   He was started on spironolactone    Is back doing some light housework  Is not doing yard work yet .  His BP is typically a bit lower at home  Systolic BP is typically 110-120s     ROS:   Studies Reviewed: .         Risk Assessment/Calculations:       Physical Exam:      Physical Exam: Blood pressure 138/80, pulse 86, height 6' (1.829 m), weight 179 lb 12.8 oz (81.6 kg), SpO2 97%.       GEN:  Well nourished, well developed in no acute  distress HEENT: Normal NECK: No JVD; No carotid bruits LYMPHATICS: No lymphadenopathy CARDIAC: RRR , no murmurs, rubs, gallops RESPIRATORY:  Clear to auscultation without rales, wheezing or rhonchi  ABDOMEN: Soft, non-tender, non-distended MUSCULOSKELETAL:  No edema; No deformity  SKIN: Warm and dry NEUROLOGIC:  Alert and oriented x 3   ECG          ASSESSMENT AND PLAN: .   Coronary artery disease.   No angina .  He has marked diffuse calcifications   He has an ostial OM stenosis of 80% but otherwise moderate AD       2.  Chronic HFrEF :  he is doing well on the Entresto  , spiro  Continue current meds.    Will have him see Dr. Michele in 3-6 months   Signed, Aleene Passe, MD

## 2023-10-30 ENCOUNTER — Ambulatory Visit: Payer: PPO | Admitting: Cardiovascular Disease

## 2023-10-30 ENCOUNTER — Encounter: Payer: Self-pay | Admitting: Cardiovascular Disease

## 2023-10-30 ENCOUNTER — Ambulatory Visit: Attending: Cardiovascular Disease | Admitting: Cardiovascular Disease

## 2023-10-30 VITALS — BP 138/80 | HR 86 | Ht 72.0 in | Wt 179.8 lb

## 2023-10-30 DIAGNOSIS — I5020 Unspecified systolic (congestive) heart failure: Secondary | ICD-10-CM | POA: Diagnosis not present

## 2023-10-30 NOTE — Patient Instructions (Signed)
  Follow-Up: At [facility], you and your health needs are our priority.  As part of our continuing mission to provide you with exceptional heart care, our providers are all part of one team.  This team includes your primary Cardiologist (physician) and Advanced Practice Providers or APPs (Physician Assistants and Nurse Practitioners) who all work together to provide you with the care you need, when you need it.  Your next appointment:   6 month(s)  Provider:   Madonna Large, DO

## 2023-11-02 DIAGNOSIS — N400 Enlarged prostate without lower urinary tract symptoms: Secondary | ICD-10-CM | POA: Diagnosis not present

## 2023-11-02 DIAGNOSIS — Z1331 Encounter for screening for depression: Secondary | ICD-10-CM | POA: Diagnosis not present

## 2023-11-02 DIAGNOSIS — I2510 Atherosclerotic heart disease of native coronary artery without angina pectoris: Secondary | ICD-10-CM | POA: Diagnosis not present

## 2023-11-02 DIAGNOSIS — M8588 Other specified disorders of bone density and structure, other site: Secondary | ICD-10-CM | POA: Diagnosis not present

## 2023-11-02 DIAGNOSIS — I5020 Unspecified systolic (congestive) heart failure: Secondary | ICD-10-CM | POA: Diagnosis not present

## 2023-11-02 DIAGNOSIS — E0500 Thyrotoxicosis with diffuse goiter without thyrotoxic crisis or storm: Secondary | ICD-10-CM | POA: Diagnosis not present

## 2023-11-02 DIAGNOSIS — E039 Hypothyroidism, unspecified: Secondary | ICD-10-CM | POA: Diagnosis not present

## 2023-11-02 DIAGNOSIS — E782 Mixed hyperlipidemia: Secondary | ICD-10-CM | POA: Diagnosis not present

## 2023-11-02 DIAGNOSIS — R972 Elevated prostate specific antigen [PSA]: Secondary | ICD-10-CM | POA: Diagnosis not present

## 2023-11-02 DIAGNOSIS — N183 Chronic kidney disease, stage 3 unspecified: Secondary | ICD-10-CM | POA: Diagnosis not present

## 2023-11-02 DIAGNOSIS — Z0000 Encounter for general adult medical examination without abnormal findings: Secondary | ICD-10-CM | POA: Diagnosis not present

## 2023-11-02 DIAGNOSIS — Z125 Encounter for screening for malignant neoplasm of prostate: Secondary | ICD-10-CM | POA: Diagnosis not present

## 2023-11-02 DIAGNOSIS — C3490 Malignant neoplasm of unspecified part of unspecified bronchus or lung: Secondary | ICD-10-CM | POA: Diagnosis not present

## 2023-11-02 DIAGNOSIS — I10 Essential (primary) hypertension: Secondary | ICD-10-CM | POA: Diagnosis not present

## 2023-11-03 ENCOUNTER — Encounter: Payer: Self-pay | Admitting: Thoracic Surgery (Cardiothoracic Vascular Surgery)

## 2023-11-03 ENCOUNTER — Ambulatory Visit
Payer: Self-pay | Attending: Thoracic Surgery (Cardiothoracic Vascular Surgery) | Admitting: Thoracic Surgery (Cardiothoracic Vascular Surgery)

## 2023-11-03 VITALS — BP 152/90 | HR 77 | Resp 20 | Ht 72.0 in | Wt 185.8 lb

## 2023-11-03 DIAGNOSIS — Z902 Acquired absence of lung [part of]: Secondary | ICD-10-CM

## 2023-11-03 LAB — LAB REPORT - SCANNED: PSA, FREE: 13.8

## 2023-11-06 ENCOUNTER — Telehealth: Payer: Self-pay | Admitting: Internal Medicine

## 2023-11-06 NOTE — Telephone Encounter (Signed)
 Left the patient a voicemail with the appointment details.

## 2023-11-06 NOTE — Progress Notes (Signed)
      [address]       [place] [address]             [phone]        Delta Deshmukh [place]in[place] Medical Record #[id number] Date of Birth: Nov 04, 1950  Referring: Isadora Hose, MD Primary Care: Loreli Kins, MD Primary Cardiologist:None  Reason for visit:   follow-up  History of Present Illness:     [place] presents for their first follow-up appointment.  Overall, he is doing well.    Physical Exam: BP (!) 152/90 (BP Location: Left Arm, Patient Position: Sitting, Cuff Size: Normal)   Pulse 77   Resp 20   Ht 6' (1.829 m)   Wt 185 lb 12.8 oz (84.3 kg)   SpO2 98% Comment: RA  BMI 25.20 kg/m   Alert NAD Incision clean.   Abdomen, ND no peripheral edema   Diagnostic Studies & Laboratory data:  Path:  FINAL MICROSCOPIC DIAGNOSIS:  A. LYMPH NODE, HILAR, EXCISION: - One lymph node negative for malignancy (0/1). - Focus of angiolymphatic invasion in extranodal fragment of stroma. - See cancer summary below. - Deepers were examined.  B. LYMPH NODE, LEVEL 7, EXCISION: - One lymph node negative for malignancy (0/1). - See cancer summary below.  C. LUNG, RIGHT LOWER LOBE, LOBECTOMY: - Invasive papillary adenocarcinoma. - See cancer summary below.  CASE SUMMARY: LUNG Standard(s): AJCC 9  SPECIMEN Synchronous Tumors: Not applicable Procedure: Lobectomy Specimen Laterality: Right  TUMOR Tumor Focality: Single focus Tumor Site: Lower lobe of lung Tumor Size      Invasive Tumor Size: 1.5 cm      Total Tumor Size: 2.1 cm Histologic Type: Invasive papillary adenocarcinoma Histologic Patterns:      Papillary: 60%      Acinar: 20%      Lepidic: 20% Histologic grade: G2, moderately differentiated Spread Through Retail banker (STAS): Present Visceral Pleura Invasion: Present Direct Invasion of Adjacent Structures: Not applicable (no other structures present) Treatment Effect: No known presurgical therapy Lymphatic and / or  Vascular Invasion: Present  MARGINS Margin Status for Invasive Tumor: All margins negative for invasive tumor      3.5 cm from bronchial and vascular margins Margin Status for Non-Invasive Tumor: All margins negative for noninvasive tumor  REGIONAL LYMPH NODES Lymph Node(s) from Prior Procedures: No known prior lymph node sampling performed Regional Lymph Node Status: All regional lymph nodes negative for tumor      Number of lymph nodes examined: 2      Nodal sites examined:           7: Subcarinal           10R: Hilar  DISTANT METASTASIS Distant Site(s) Involved, if applicable: Not applicable  PATHOLOGIC STAGE CLASSIFICATION (pTNM, AJCC 9th Edition): Modified Classification: Not applicable pT2a      T Suffix: Not applicable pN0     Assessment / Plan:   73 y.o. male s/p RLLectom for  T2aN0M0 adenocarcinoma.  I have made a referral to medical oncology, along with a 1 month follow-up with a CXR.     [place] 11/06/2023 7:59 AM

## 2023-11-29 ENCOUNTER — Other Ambulatory Visit: Payer: Self-pay | Admitting: *Deleted

## 2023-11-29 DIAGNOSIS — C3490 Malignant neoplasm of unspecified part of unspecified bronchus or lung: Secondary | ICD-10-CM

## 2023-11-30 ENCOUNTER — Inpatient Hospital Stay: Admitting: Internal Medicine

## 2023-11-30 ENCOUNTER — Inpatient Hospital Stay: Attending: Internal Medicine

## 2023-11-30 VITALS — BP 129/77 | HR 64 | Temp 98.0°F | Resp 16 | Ht 72.0 in | Wt 181.0 lb

## 2023-11-30 DIAGNOSIS — C3431 Malignant neoplasm of lower lobe, right bronchus or lung: Secondary | ICD-10-CM | POA: Diagnosis not present

## 2023-11-30 DIAGNOSIS — C3490 Malignant neoplasm of unspecified part of unspecified bronchus or lung: Secondary | ICD-10-CM

## 2023-11-30 DIAGNOSIS — Z902 Acquired absence of lung [part of]: Secondary | ICD-10-CM | POA: Diagnosis not present

## 2023-11-30 DIAGNOSIS — R7989 Other specified abnormal findings of blood chemistry: Secondary | ICD-10-CM | POA: Diagnosis not present

## 2023-11-30 LAB — CMP (CANCER CENTER ONLY)
ALT: 13 U/L (ref 0–44)
AST: 24 U/L (ref 15–41)
Albumin: 4 g/dL (ref 3.5–5.0)
Alkaline Phosphatase: 50 U/L (ref 38–126)
Anion gap: 4 — ABNORMAL LOW (ref 5–15)
BUN: 15 mg/dL (ref 8–23)
CO2: 30 mmol/L (ref 22–32)
Calcium: 9.2 mg/dL (ref 8.9–10.3)
Chloride: 105 mmol/L (ref 98–111)
Creatinine: 1.26 mg/dL — ABNORMAL HIGH (ref 0.61–1.24)
GFR, Estimated: >60 mL/min (ref >60)
Glucose, Bld: 80 mg/dL (ref 70–99)
Potassium: 4 mmol/L (ref 3.5–5.1)
Sodium: 139 mmol/L (ref 135–145)
Total Bilirubin: 0.5 mg/dL (ref 0.0–1.2)
Total Protein: 7.5 g/dL (ref 6.5–8.1)

## 2023-11-30 LAB — CBC WITH DIFFERENTIAL (CANCER CENTER ONLY)
Abs Immature Granulocytes: 0.01 K/uL (ref 0.00–0.07)
Basophils Absolute: 0.1 K/uL (ref 0.0–0.1)
Basophils Relative: 1 %
Eosinophils Absolute: 0.2 K/uL (ref 0.0–0.5)
Eosinophils Relative: 3 %
HCT: 39.7 % (ref 39.0–52.0)
Hemoglobin: 13.1 g/dL (ref 13.0–17.0)
Immature Granulocytes: 0 %
Lymphocytes Relative: 34 %
Lymphs Abs: 2.2 K/uL (ref 0.7–4.0)
MCH: 27 pg (ref 26.0–34.0)
MCHC: 33 g/dL (ref 30.0–36.0)
MCV: 81.7 fL (ref 80.0–100.0)
Monocytes Absolute: 0.4 K/uL (ref 0.1–1.0)
Monocytes Relative: 6 %
Neutro Abs: 3.6 K/uL (ref 1.7–7.7)
Neutrophils Relative %: 56 %
Platelet Count: 188 K/uL (ref 150–400)
RBC: 4.86 MIL/uL (ref 4.22–5.81)
RDW: 14.6 % (ref 11.5–15.5)
WBC Count: 6.4 K/uL (ref 4.0–10.5)
nRBC: 0 % (ref 0.0–0.2)

## 2023-11-30 NOTE — Progress Notes (Signed)
 [facility] [phone]   [phone]) [phone]  OFFICE PROGRESS NOTE  Omar Kins, MD [address] [place] [address]  DIAGNOSIS: Stage Ib (T2a, N0, M0) non-small cell lung cancer, adenocarcinoma involving the right lower lobe diagnosed in May 2025  PRIOR THERAPY: Status post right lower lobectomy with mediastinal lymph node sampling under the care of Dr. Shyrl on 10/16/2023.  The final tumor size was 2.1 cm with evidence for visceropleural invasion.  CURRENT THERAPY: Observation.  INTERVAL HISTORY: Omar Hill 73 y.o. male returns to the clinic today for follow-up visit accompanied by his wife. Discussed the use of AI scribe software for clinical note transcription with the patient, who gave verbal consent to proceed.  History of Present Illness Omar Hill is a 73 year old male with stage 1B non-small cell lung cancer who presents for post-surgical evaluation.  Diagnosed with stage 1B non-small cell lung cancer, adenocarcinoma, in May 2025. Underwent right lower lobectomy with mediastinal lymph node sampling on October 16, 2023. Tumor measured 2.1 cm and involved the visceral pleura. All sampled lymph nodes were negative for cancer.  Post-surgery, the external surgical site has healed well, though he experiences some internal 'twinges'. Inquired about using cocoa butter on the incision site, which is not an open wound. No other complaints noted. Blood work post-surgery shows hemoglobin levels have returned to normal. Kidney function is slightly elevated at 1.26, consistent with previous levels.  No new complaints or symptoms.     MEDICAL HISTORY: Past Medical History:  Diagnosis Date   Aortic atherosclerosis (HCC)    BPH (benign prostatic hyperplasia)    Cancer (HCC) 2025   Right Lung Cancer   Cervical spinal stenosis    Cervicalgia    CHF (congestive heart failure) (HCC)    CKD (chronic kidney disease), stage III  (HCC)    Coronary artery disease    DDD (degenerative disc disease), lumbosacral    ED (erectile dysfunction)    a.) on PDE5i (tadalafil)   Graves disease    a.) s/p I-131 ablation   Hepatic cyst (multiple)    HFrEF (heart failure with reduced ejection fraction) (HCC)    HTN (hypertension)    Hyperlipidemia    LBBB (left bundle branch block)    Long-term use of aspirin  therapy    Myocardial infarction [facility])    based on stress test results.   Nephrolithiasis    Osteopenia    Postoperative hypothyroidism    Prostatitis 07/2023   Right lower lobe pulmonary nodule 05/11/2023   a.) CT chest 05/11/2023: spiculated 1.4 cm RLL mass; b.) high resolution CT chest 08/09/2023: partially solid spiculated nodule anterior RLL measuring 1.7 x 2.5 cm with an internal 1.3 x 1.6 cm solid component; adjacent satellite ground-glass nodule along the posterior margin measuring 5 mm; c.) PET CT 09/05/2023: hypermetabolic RLL spiculated nodule with max SUV 3.7    ALLERGIES:  is allergic to pollen extract, benadryl [diphenhydramine], and mucinex dm [dm-guaifenesin er].  MEDICATIONS:  Current Outpatient Medications  Medication Sig Dispense Refill   acetaminophen  (TYLENOL ) 325 MG tablet Take 2 tablets (650 mg total) by mouth every 6 (six) hours as needed.     aspirin  EC 81 MG tablet Take 81 mg by mouth daily. Swallow whole.     Calcium  Carbonate (CALCIUM  500 PO) Take 1 tablet by mouth daily.     Cholecalciferol (VITAMIN D) 50 MCG (2000 UT) tablet Take 2,000 Units by mouth  daily.     levothyroxine  (SYNTHROID ) 150 MCG tablet Take 150 mcg by mouth daily before breakfast.     MAGNESIUM PO Take 1 tablet by mouth every evening.     Multiple Vitamin (MULTIVITAMIN) capsule Take 1 capsule by mouth daily.     multivitamin-lutein (OCUVITE-LUTEIN) CAPS capsule Take 1 capsule by mouth daily.     Omega-3 Fatty Acids (FISH OIL) 1000 MG CAPS Take 1,000 mg by mouth daily.     rosuvastatin  (CRESTOR ) 40 MG tablet Take 40 mg  by mouth every evening.     sacubitril -valsartan  (ENTRESTO ) 49-51 MG Take 1 tablet by mouth 2 (two) times daily. 180 tablet 3   spironolactone  (ALDACTONE ) 25 MG tablet Take 0.5 tablets (12.5 mg total) by mouth daily. 15 tablet 11   tadalafil (CIALIS) 5 MG tablet Take 5 mg by mouth daily as needed for erectile dysfunction.     tamsulosin  (FLOMAX ) 0.4 MG CAPS capsule Take 0.4 mg by mouth daily after breakfast.     No current facility-administered medications for this visit.    SURGICAL HISTORY:  Past Surgical History:  Procedure Laterality Date   ACHILLES TENDON REPAIR Left 1993   BRONCHOSCOPY, WITH BIOPSY USING ELECTROMAGNETIC NAVIGATION Bilateral 09/12/2023   Procedure: ROBOTIC ASSISTED NAVIGATIONAL BRONCHOSCOPY;  Surgeon: Isadora Hose, MD;  Location: [facility] ORS;  Service: Pulmonary;  Laterality: Bilateral;   COLONOSCOPY     HAND TENDON SURGERY Left    thumb   INTERCOSTAL NERVE BLOCK Right 10/16/2023   Procedure: BLOCK, NERVE, INTERCOSTAL;  Surgeon: Shyrl Linnie KIDD, MD;  Location: [facility] OR;  Service: Thoracic;  Laterality: Right;   LEFT HEART CATH AND CORONARY ANGIOGRAPHY N/A 07/31/2023   Procedure: LEFT HEART CATH AND CORONARY ANGIOGRAPHY;  Surgeon: Ladona Heinz, MD;  Location: [facility] INVASIVE CV [facility];  Service: Cardiovascular;  Laterality: N/A;   LOBECTOMY, LUNG, ROBOT-ASSISTED, USING VATS Right 10/16/2023   Procedure: LOBECTOMY, LUNG, ROBOT-ASSISTED, USING VATS;  Surgeon: Shyrl Linnie KIDD, MD;  Location: [facility] OR;  Service: Thoracic;  Laterality: Right;  RIGHT ROBOTIC RIGHT LOWER LOBECTOMY   LYMPH NODE BIOPSY Right 10/16/2023   Procedure: LYMPH NODE BIOPSY;  Surgeon: Shyrl Linnie KIDD, MD;  Location: [facility] OR;  Service: Thoracic;  Laterality: Right;   TONSILLECTOMY     As a child    REVIEW OF SYSTEMS:  Constitutional: negative Eyes: negative Ears, nose, mouth, throat, and face: negative Respiratory: positive for pleurisy/chest pain Cardiovascular: negative Gastrointestinal:  negative Genitourinary:negative Integument/breast: negative Hematologic/lymphatic: negative Musculoskeletal:negative Neurological: negative Behavioral/Psych: negative Endocrine: negative Allergic/Immunologic: negative   PHYSICAL EXAMINATION: General appearance: alert, cooperative, and no distress Head: Normocephalic, without obvious abnormality, atraumatic Neck: no adenopathy, no JVD, supple, symmetrical, trachea midline, and thyroid  not enlarged, symmetric, no tenderness/mass/nodules Lymph nodes: Cervical, supraclavicular, and axillary nodes normal. Resp: clear to auscultation bilaterally Back: symmetric, no curvature. ROM normal. No CVA tenderness. Cardio: regular rate and rhythm, S1, S2 normal, no murmur, click, rub or gallop GI: soft, non-tender; bowel sounds normal; no masses,  no organomegaly Extremities: extremities normal, atraumatic, no cyanosis or edema Neurologic: Alert and oriented X 3, normal strength and tone. Normal symmetric reflexes. Normal coordination and gait  ECOG PERFORMANCE STATUS: 1 - Symptomatic but completely ambulatory  Blood pressure 129/77, pulse 64, temperature 98 F (36.7 C), temperature source Temporal, resp. rate 16, height 6' (1.829 m), weight 181 lb (82.1 kg), SpO2 100%.  LABORATORY DATA: Lab Results  Component Value Date   WBC 12.6 (H) 10/18/2023   HGB 12.5 (L) 10/18/2023   HCT 37.8 (  L) 10/18/2023   MCV 83.1 10/18/2023   PLT 203 10/18/2023      Chemistry      Component Value Date/Time   NA 136 10/18/2023 0320   NA 142 10/06/2023 0931   K 3.8 10/18/2023 0320   CL 103 10/18/2023 0320   CO2 27 10/18/2023 0320   BUN 10 10/18/2023 0320   BUN 14 10/06/2023 0931   CREATININE 1.13 10/18/2023 0320   CREATININE 1.27 (H) 09/21/2023 1425      Component Value Date/Time   CALCIUM  8.1 (L) 10/18/2023 0320   ALKPHOS 41 10/18/2023 0320   AST 28 10/18/2023 0320   AST 24 09/21/2023 1425   ALT 13 10/18/2023 0320   ALT 16 09/21/2023 1425   BILITOT  0.8 10/18/2023 0320   BILITOT 0.5 09/21/2023 1425       RADIOGRAPHIC STUDIES: No results found.  ASSESSMENT AND PLAN: This is a very pleasant 73 years old African-American male with Stage Ib (t2a, N0, M0) non-small cell lung cancer, adenocarcinoma involving the right lower lobe diagnosed in May 2025 He is status post right lower lobectomy with mediastinal lymph node sampling under the care of Dr. Shyrl on 10/16/2023.  The final tumor size was 2.1 cm with evidence for visceropleural invasion. Assessment and Plan Assessment & Plan Stage 1B non-small cell lung cancer, adenocarcinoma Stage 1B non-small cell lung cancer, adenocarcinoma diagnosed in May 2025. Status post right lower lobectomy with mediastinal lymph node sampling on October 16, 2023. Tumor was 2.1 cm with visceral pleural involvement, upgrading the stage to 1B. No adjuvant treatment required due to small tumor size and complete resection. Close monitoring is necessary due to potential seeding in the pleural space. - Schedule follow-up in 6 months with a scan - Continue follow-up every 6 months for the first two years, then annually - Instruct to call if any concerning symptoms arise  Elevated creatinine Creatinine level is 1.26, slightly elevated but consistent with previous levels (1.35 in the past). Kidney function is stable post-surgery. Regular monitoring is necessary, especially when using contrast for imaging. - Monitor kidney function regularly, especially before imaging requiring contrast The patient was advised to call immediately if he has any concerning symptoms in the interval. The patient voices understanding of current disease status and treatment options and is in agreement with the current care plan.  All questions were answered. The patient knows to call the clinic with any problems, questions or concerns. We can certainly see the patient much sooner if necessary.  The total time spent in the appointment was 30  minutes including review of chart and various tests results, discussions about plan of care and coordination of care plan .  Disclaimer: This note was dictated with voice recognition software. Similar sounding words can inadvertently be transcribed and may not be corrected upon review.

## 2023-12-12 DIAGNOSIS — M25561 Pain in right knee: Secondary | ICD-10-CM | POA: Diagnosis not present

## 2023-12-12 DIAGNOSIS — M25461 Effusion, right knee: Secondary | ICD-10-CM | POA: Diagnosis not present

## 2023-12-12 DIAGNOSIS — M9901 Segmental and somatic dysfunction of cervical region: Secondary | ICD-10-CM | POA: Diagnosis not present

## 2023-12-12 DIAGNOSIS — M62830 Muscle spasm of back: Secondary | ICD-10-CM | POA: Diagnosis not present

## 2023-12-13 DIAGNOSIS — R972 Elevated prostate specific antigen [PSA]: Secondary | ICD-10-CM | POA: Diagnosis not present

## 2023-12-14 ENCOUNTER — Other Ambulatory Visit: Payer: Self-pay | Admitting: Thoracic Surgery (Cardiothoracic Vascular Surgery)

## 2023-12-14 DIAGNOSIS — R911 Solitary pulmonary nodule: Secondary | ICD-10-CM

## 2023-12-15 ENCOUNTER — Ambulatory Visit
Admission: RE | Admit: 2023-12-15 | Discharge: 2023-12-15 | Disposition: A | Discharge status: Home / Self Care | Payer: Self-pay | Source: Ambulatory Visit | Attending: Cardiovascular Disease | Admitting: Cardiovascular Disease

## 2023-12-15 ENCOUNTER — Ambulatory Visit (INDEPENDENT_AMBULATORY_CARE_PROVIDER_SITE_OTHER): Payer: Self-pay | Admitting: Thoracic Surgery (Cardiothoracic Vascular Surgery)

## 2023-12-15 VITALS — BP 150/91 | HR 55 | Resp 20 | Ht 72.0 in | Wt 179.0 lb

## 2023-12-15 DIAGNOSIS — Z902 Acquired absence of lung [part of]: Secondary | ICD-10-CM

## 2023-12-15 DIAGNOSIS — R911 Solitary pulmonary nodule: Secondary | ICD-10-CM | POA: Diagnosis not present

## 2023-12-15 DIAGNOSIS — Z48813 Encounter for surgical aftercare following surgery on the respiratory system: Secondary | ICD-10-CM | POA: Diagnosis not present

## 2023-12-15 DIAGNOSIS — R9389 Abnormal findings on diagnostic imaging of other specified body structures: Secondary | ICD-10-CM | POA: Diagnosis not present

## 2023-12-15 DIAGNOSIS — C3490 Malignant neoplasm of unspecified part of unspecified bronchus or lung: Secondary | ICD-10-CM | POA: Diagnosis not present

## 2023-12-15 NOTE — Progress Notes (Signed)
      [address]       [place] [address]             [phone]        Demetreus Lothamer [place] Medical Record #[id number] Date of Birth: February 02, 1951  Referring: Isadora Hose, MD Primary Care: Loreli Kins, MD Primary Cardiologist:Sunit Michele, DO  Reason for visit:   follow-up  History of Present Illness:     Winnie Barsky Rieke present for 1 month follow-up after a RLLectomy.  Overall, he is doing well  Physical Exam: BP (!) 150/91   Pulse (!) 55   Resp 20   Ht 6' (1.829 m)   Wt 179 lb (81.2 kg)   SpO2 97% Comment: RA  BMI 24.28 kg/m   Alert NAD Incision clean.   Abdomen, ND no peripheral edema   Diagnostic Studies & Laboratory data: CXR:  clear     Assessment / Plan:   73 y.o. male s/p RLLectom for  T2aN0M0 adenocarcinoma. He will continue to follow with medical oncology.   [name] 12/15/2023 10:35 AM

## 2023-12-26 DIAGNOSIS — M62830 Muscle spasm of back: Secondary | ICD-10-CM | POA: Diagnosis not present

## 2023-12-26 DIAGNOSIS — M9901 Segmental and somatic dysfunction of cervical region: Secondary | ICD-10-CM | POA: Diagnosis not present

## 2023-12-26 DIAGNOSIS — M25561 Pain in right knee: Secondary | ICD-10-CM | POA: Diagnosis not present

## 2023-12-26 DIAGNOSIS — M25461 Effusion, right knee: Secondary | ICD-10-CM | POA: Diagnosis not present

## 2024-01-01 ENCOUNTER — Other Ambulatory Visit (HOSPITAL_COMMUNITY): Payer: Self-pay

## 2024-01-01 ENCOUNTER — Telehealth: Payer: Self-pay | Admitting: Physician Assistant

## 2024-01-01 NOTE — Telephone Encounter (Signed)
 Called and left message for pt (OK per DPR) pharmacy did not have his medication in stock but will order.  Advised pt to f/u with pharmacy tomorrow afternoon to make sure they fill it.  Requested he call back if any questions or concerns.

## 2024-01-01 NOTE — Telephone Encounter (Signed)
 Per test claim brand is covered without a PA. Spoke to [employer], claim is going through fine on brand name. Pharmacy does not have in stock and will have to order. Will arrive tomorrow. Patient can check back with pharmacy for pickup on or after 01/02/24.

## 2024-01-01 NOTE — Telephone Encounter (Signed)
  Pt c/o medication issue:  1. Name of Medication:   sacubitril -valsartan  (ENTRESTO ) 49-51 MG    2. How are you currently taking this medication (dosage and times per day)? Take 1 tablet by mouth 2 (two) times daily.   3. Are you having a reaction (difficulty breathing--STAT)? No   4. What is your medication issue? The patient said that when he tried to get a refill, his insurance kept changing it to a generic version, which would cost him $900. He believes a prior authorization needs to be submitted for his Entresto .

## 2024-01-09 DIAGNOSIS — M62830 Muscle spasm of back: Secondary | ICD-10-CM | POA: Diagnosis not present

## 2024-01-09 DIAGNOSIS — M25561 Pain in right knee: Secondary | ICD-10-CM | POA: Diagnosis not present

## 2024-01-09 DIAGNOSIS — M25461 Effusion, right knee: Secondary | ICD-10-CM | POA: Diagnosis not present

## 2024-01-09 DIAGNOSIS — M9901 Segmental and somatic dysfunction of cervical region: Secondary | ICD-10-CM | POA: Diagnosis not present

## 2024-01-10 ENCOUNTER — Other Ambulatory Visit: Payer: Self-pay | Admitting: Urology

## 2024-01-10 DIAGNOSIS — R972 Elevated prostate specific antigen [PSA]: Secondary | ICD-10-CM

## 2024-01-23 DIAGNOSIS — M25461 Effusion, right knee: Secondary | ICD-10-CM | POA: Diagnosis not present

## 2024-01-23 DIAGNOSIS — M9901 Segmental and somatic dysfunction of cervical region: Secondary | ICD-10-CM | POA: Diagnosis not present

## 2024-01-23 DIAGNOSIS — M25561 Pain in right knee: Secondary | ICD-10-CM | POA: Diagnosis not present

## 2024-01-23 DIAGNOSIS — M62830 Muscle spasm of back: Secondary | ICD-10-CM | POA: Diagnosis not present

## 2024-01-24 ENCOUNTER — Other Ambulatory Visit

## 2024-02-06 DIAGNOSIS — M9901 Segmental and somatic dysfunction of cervical region: Secondary | ICD-10-CM | POA: Diagnosis not present

## 2024-02-06 DIAGNOSIS — M25461 Effusion, right knee: Secondary | ICD-10-CM | POA: Diagnosis not present

## 2024-02-06 DIAGNOSIS — M25561 Pain in right knee: Secondary | ICD-10-CM | POA: Diagnosis not present

## 2024-02-06 DIAGNOSIS — R972 Elevated prostate specific antigen [PSA]: Secondary | ICD-10-CM | POA: Diagnosis not present

## 2024-02-06 DIAGNOSIS — M62830 Muscle spasm of back: Secondary | ICD-10-CM | POA: Diagnosis not present

## 2024-02-20 DIAGNOSIS — M25461 Effusion, right knee: Secondary | ICD-10-CM | POA: Diagnosis not present

## 2024-02-20 DIAGNOSIS — M25561 Pain in right knee: Secondary | ICD-10-CM | POA: Diagnosis not present

## 2024-02-20 DIAGNOSIS — M9901 Segmental and somatic dysfunction of cervical region: Secondary | ICD-10-CM | POA: Diagnosis not present

## 2024-02-20 DIAGNOSIS — M62830 Muscle spasm of back: Secondary | ICD-10-CM | POA: Diagnosis not present

## 2024-02-20 DIAGNOSIS — M545 Low back pain, unspecified: Secondary | ICD-10-CM | POA: Diagnosis not present

## 2024-02-20 DIAGNOSIS — M9905 Segmental and somatic dysfunction of pelvic region: Secondary | ICD-10-CM | POA: Diagnosis not present

## 2024-02-20 DIAGNOSIS — M9904 Segmental and somatic dysfunction of sacral region: Secondary | ICD-10-CM | POA: Diagnosis not present

## 2024-02-23 ENCOUNTER — Ambulatory Visit
Admission: RE | Admit: 2024-02-23 | Discharge: 2024-02-23 | Disposition: A | Source: Ambulatory Visit | Attending: Urology

## 2024-02-23 DIAGNOSIS — R972 Elevated prostate specific antigen [PSA]: Secondary | ICD-10-CM | POA: Diagnosis not present

## 2024-02-23 MED ORDER — GADOPICLENOL 0.5 MMOL/ML IV SOLN
8.0000 mL | Freq: Once | INTRAVENOUS | Status: AC | PRN
  Administered 2024-02-23: 8 mL via INTRAVENOUS

## 2024-02-29 DIAGNOSIS — R972 Elevated prostate specific antigen [PSA]: Secondary | ICD-10-CM | POA: Diagnosis not present

## 2024-03-05 DIAGNOSIS — M62830 Muscle spasm of back: Secondary | ICD-10-CM | POA: Diagnosis not present

## 2024-03-05 DIAGNOSIS — R399 Unspecified symptoms and signs involving the genitourinary system: Secondary | ICD-10-CM | POA: Diagnosis not present

## 2024-03-05 DIAGNOSIS — M545 Low back pain, unspecified: Secondary | ICD-10-CM | POA: Diagnosis not present

## 2024-03-05 DIAGNOSIS — M25461 Effusion, right knee: Secondary | ICD-10-CM | POA: Diagnosis not present

## 2024-03-05 DIAGNOSIS — M9905 Segmental and somatic dysfunction of pelvic region: Secondary | ICD-10-CM | POA: Diagnosis not present

## 2024-03-05 DIAGNOSIS — M9904 Segmental and somatic dysfunction of sacral region: Secondary | ICD-10-CM | POA: Diagnosis not present

## 2024-03-05 DIAGNOSIS — M9901 Segmental and somatic dysfunction of cervical region: Secondary | ICD-10-CM | POA: Diagnosis not present

## 2024-03-05 DIAGNOSIS — R972 Elevated prostate specific antigen [PSA]: Secondary | ICD-10-CM | POA: Diagnosis not present

## 2024-03-05 DIAGNOSIS — M25561 Pain in right knee: Secondary | ICD-10-CM | POA: Diagnosis not present

## 2024-03-21 DIAGNOSIS — M25511 Pain in right shoulder: Secondary | ICD-10-CM | POA: Diagnosis not present

## 2024-03-21 DIAGNOSIS — M542 Cervicalgia: Secondary | ICD-10-CM | POA: Diagnosis not present

## 2024-03-21 DIAGNOSIS — M62838 Other muscle spasm: Secondary | ICD-10-CM | POA: Diagnosis not present

## 2024-03-21 DIAGNOSIS — M5030 Other cervical disc degeneration, unspecified cervical region: Secondary | ICD-10-CM | POA: Diagnosis not present

## 2024-04-12 DIAGNOSIS — R972 Elevated prostate specific antigen [PSA]: Secondary | ICD-10-CM | POA: Diagnosis not present

## 2024-04-24 ENCOUNTER — Encounter: Payer: Self-pay | Admitting: Cardiology

## 2024-04-24 ENCOUNTER — Ambulatory Visit: Attending: Cardiovascular Disease | Admitting: Cardiology

## 2024-04-24 VITALS — BP 135/72 | HR 66 | Resp 16 | Ht 72.0 in | Wt 190.4 lb

## 2024-04-24 DIAGNOSIS — I2510 Atherosclerotic heart disease of native coronary artery without angina pectoris: Secondary | ICD-10-CM

## 2024-04-24 DIAGNOSIS — I10 Essential (primary) hypertension: Secondary | ICD-10-CM

## 2024-04-24 DIAGNOSIS — I5020 Unspecified systolic (congestive) heart failure: Secondary | ICD-10-CM

## 2024-04-24 DIAGNOSIS — E782 Mixed hyperlipidemia: Secondary | ICD-10-CM | POA: Diagnosis not present

## 2024-04-24 DIAGNOSIS — I447 Left bundle-branch block, unspecified: Secondary | ICD-10-CM | POA: Diagnosis not present

## 2024-04-24 NOTE — Progress Notes (Signed)
 Cardiology Office Note:  .   Date:  04/24/2024  ID:  Omar Hill, DOB 03-07-1951, MRN [id number] PCP:  Loreli Kins, MD  Former Cardiology Providers: Dr. Aleene Passe [facility] Providers Cardiologist:  Madonna Large, DO , [facility] (established care 04/24/2024) Electrophysiologist:  None  Click to update primary MD,subspecialty MD or APP then REFRESH:1}    Chief Complaint  Patient presents with   Follow-up    CAD and CHF     History of Present Illness: .   Omar Hill is a 73 y.o. 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   Formally under the care of Dr. Aleene Passe who last saw Omar Hill back in 10/2021. I am seeing him for the first time to re-establishing care.   Reviewed last progress note from Dr. Passe October 30, 2023 as part of transition of care.  In summary, patient had a coronary calcium  score which was noted to be severe.  He did have an echocardiogram and stress test.  The echocardiogram noted mildly reduced LVEF, global hypokinesis, LV dyssynchrony given his underlying conduction disease.  Nuclear stress test noted predominantly fixed defect and no obvious reversibility.  He did undergo left heart catheterization and was noted to have disease in the LAD and OM distribution but since he was asymptomatic medical management was recommended.  Patient's GDMT has been uptitrated and he presents today for a 73-month follow-up visit.  Since last office visit Brewer denies any anginal chest pain or heart failure symptoms.   No hospitalizations or urgent care visits for cardiovascular reasons.   He has been compliant with his medical therapy and endorses no concerns.  Physical endurance remains stable - elliptical 15 minutes at least three days a week (no  symptoms) and before winter weather was walking 35-40 minutes 3-4 days / week.  Home SBP ranges between 120-130 mmHg.     Review of Systems: .   Review of Systems  Cardiovascular:  Negative for chest pain, claudication, irregular heartbeat, leg swelling, near-syncope, orthopnea, palpitations, paroxysmal nocturnal dyspnea and syncope.  Respiratory:  Negative for shortness of breath.   Hematologic/Lymphatic: Negative for bleeding problem.    Studies Reviewed:   EKG: EKG Interpretation Date/Time:  Wednesday April 24 2024 11:22:16 EST Ventricular Rate:  67 PR Interval:  194 QRS Duration:  168 QT Interval:  432 QTC Calculation: 456 R Axis:   39  Text Interpretation: Sinus rhythm with Premature atrial complexes Left bundle branch block When compared with ECG of 12-Oct-2023 10:55, Premature atrial complexes are now Present T wave inversion now evident in Inferior leads noted on prior tracing from February 2025 Confirmed by Large Madonna [phone]) on 04/24/2024 12:04:44 PM  Echocardiogram: 06/2023  1. There is profound LBBB-related left ventricular dyssynchrony. Left  ventricular ejection fraction, by estimation, is 40 to 45%. The left  ventricle has mildly decreased function. The left ventricle demonstrates  global hypokinesis. There is mild  concentric left ventricular hypertrophy. Left ventricular diastolic  parameters are consistent with Grade I diastolic dysfunction (impaired  relaxation). The average left ventricular global longitudinal strain is  -22.2 %. The global longitudinal strain is  normal.   2. Right ventricular systolic function is normal. The right ventricular  size is normal. Tricuspid regurgitation signal is inadequate for assessing  PA pressure.   3. Left atrial size was mildly dilated.  4. The mitral valve is normal in structure. No evidence of mitral valve  regurgitation.   5. The aortic valve is tricuspid. There is mild calcification of the  aortic valve.  There is mild thickening of the aortic valve. Aortic valve  regurgitation is not visualized. Aortic valve sclerosis/calcification is  present, without any evidence of  aortic stenosis.   6. The inferior vena cava is normal in size with greater than 50%  respiratory variability, suggesting right atrial pressure of 3 mmHg.   Stress Testing: February 2025 Pharmacological stress test Finding consistent with infarction due to fixed perfusion defect. No evidence of ischemia LVEF by gated SPECT 48%. EDV mildly enlarged. Intermediate risk study See report for additional details  Left Heart Catheterization 07/31/23: Hemodynamic data: LVEDP 11 mmHg, no pressure gradient across the aortic valve.   Angiographic data: There is severe diffuse coronary calcification involving the proximal RCA, LM, LAD and CX much more severe in LAD. LM: Large-caliber vessel, it is disease-free. LCx: Large-caliber vessel, gives origin to a very large OM 2 with a ostial 80% stenosis and OM 3, disease-free. LAD: Large-caliber vessel, gives origin to a small sized D1 which has secondary branch, D1 is diffusely diseased.  There is a prox LAD 30% stenosis followed by origin of a moderate to large size D2 with minimal disease and moderate-sized D3 that is diffusely diseased.  Apical LAD has minimal disease.      Impression and recommendations: Patient has angiographically significant mid LAD stenosis and also OM 2 branch of CX stenosis.  However patient is completely asymptomatic without chest pain or symptoms of heart failure, states that he walks at least 30 minutes on a daily basis in a brisk fashion without any symptoms.  In view of this, although has calcific high-grade stenosis in the mid LAD, would recommend medical therapy in the absence of symptoms.  As proximal LAD is not involved, would recommend continued medical therapy.  RADIOLOGY: N/A  Risk Assessment/Calculations:   NA   Labs:       Latest Ref Rng  & Units 11/30/2023    8:40 AM 10/18/2023    3:20 AM 10/17/2023    2:23 AM  CBC  WBC 4.0 - 10.5 K/uL 6.4  12.6  14.2   Hemoglobin 13.0 - 17.0 g/dL 86.8  87.4  87.4   Hematocrit 39.0 - 52.0 % 39.7  37.8  38.2   Platelets 150 - 400 K/uL 188  203  221        Latest Ref Rng & Units 11/30/2023    8:40 AM 10/18/2023    3:20 AM 10/17/2023    2:23 AM  BMP  Glucose 70 - 99 mg/dL 80  96  852   BUN 8 - 23 mg/dL 15  10  13    Creatinine 0.61 - 1.24 mg/dL 8.73  8.86  8.67   Sodium 135 - 145 mmol/L 139  136  135   Potassium 3.5 - 5.1 mmol/L 4.0  3.8  4.3   Chloride 98 - 111 mmol/L 105  103  105   CO2 22 - 32 mmol/L 30  27  23    Calcium  8.9 - 10.3 mg/dL 9.2  8.1  8.5       Latest Ref Rng & Units 11/30/2023    8:40 AM 10/18/2023    3:20 AM 10/17/2023    2:23 AM  CMP  Glucose 70 - 99 mg/dL 80  96  852   BUN 8 - 23 mg/dL  15  10  13    Creatinine 0.61 - 1.24 mg/dL 8.73  8.86  8.67   Sodium 135 - 145 mmol/L 139  136  135   Potassium 3.5 - 5.1 mmol/L 4.0  3.8  4.3   Chloride 98 - 111 mmol/L 105  103  105   CO2 22 - 32 mmol/L 30  27  23    Calcium  8.9 - 10.3 mg/dL 9.2  8.1  8.5   Total Protein 6.5 - 8.1 g/dL 7.5  6.1    Total Bilirubin 0.0 - 1.2 mg/dL 0.5  0.8    Alkaline Phos 38 - 126 U/L 50  41    AST 15 - 41 U/L 24  28    ALT 0 - 44 U/L 13  13      No results found for: CHOL, HDL, LDLCALC, LDLDIRECT, TRIG, CHOLHDL No results for input(s): LIPOA in the last 8760 hours. No components found for: NTPROBNP No results for input(s): PROBNP in the last 8760 hours. No results for input(s): TSH in the last 8760 hours.  Physical Exam:    Today's Vitals   04/24/24 1120  BP: 135/72  Pulse: 66  Resp: 16  SpO2: 98%  Weight: 190 lb 6.4 oz (86.4 kg)  Height: 6' (1.829 m)   Body mass index is 25.82 kg/m. Wt Readings from Last 3 Encounters:  04/24/24 190 lb 6.4 oz (86.4 kg)  12/15/23 179 lb (81.2 kg)  11/30/23 181 lb (82.1 kg)    Physical Exam  Constitutional: No distress.   hemodynamically stable  Neck: No JVD present.  Cardiovascular: Normal rate, regular rhythm, S1 normal and S2 normal. Exam reveals no gallop, no S3 and no S4.  No murmur heard. Pulmonary/Chest: Effort normal and breath sounds normal. No stridor. He has no wheezes. He has no rales.  Musculoskeletal:        General: No edema.     Cervical back: Neck supple.  Skin: Skin is warm.     Impression & Recommendation(s):  Impression:   ICD-10-CM   1. Heart failure with mildly reduced ejection fraction (HFmrEF, 41-49%) (HCC)  I50.20 EKG 12-Lead    2. LBBB (left bundle branch block)  I44.7     3. Coronary artery disease involving native coronary artery of native heart without angina pectoris  I25.10     4. Coronary artery calcification  I25.10     5. Benign hypertension  I10     6. Mixed hyperlipidemia  E78.2        Recommendation(s):  Heart failure with mildly reduced ejection fraction (HFmrEF, 41-49%) (HCC) LBBB (left bundle branch block) Stage B, NYHA class I Echo February 2025: LVEF 40-45% Continue Entresto  49/51 mg p.o. twice daily. Continue Aldactone  12.5 mg p.o. daily. Will hold off on SGLT2 inhibitors at this time given his history of UTI/prostatitis (per patient)  Coronary artery disease involving native coronary artery of native heart without angina pectoris Coronary artery calcification Denies anginal chest pain Continue aspirin  81 mg p.o. daily. Continue Crestor  40 mg p.o. nightly. Overall functional capacity remains relatively stable. EKG today illustrates sinus rhythm with ST-T changes, new when compared to last EKG.  However similar findings noted on prior tracing Fasting lipids from June 2025 reviewed via [facility] database, LDL at goal 39 mg/dL and triglycerides 61 mg/dL Reviewed his ischemic workup thus far. Patient is made aware that he does have coronary artery disease but given the fact that he is asymptomatic the decision was to continue medical  therapy until  unless he notices change in clinical status.  Patient is aware and agreeable with the plan of care.  Benign hypertension Office blood pressures are acceptable. Home blood pressures are better controlled with SBP ranging between 120-130 mmHg. Medications as discussed above  Mixed hyperlipidemia LDL very well-controlled. Continue Crestor  40 mg p.o. nightly   Orders Placed:  Orders Placed This Encounter  Procedures   EKG 12-Lead     Final Medication List:   No orders of the defined types were placed in this encounter.   There are no discontinued medications.   Current Outpatient Medications:    acetaminophen  (TYLENOL ) 325 MG tablet, Take 2 tablets (650 mg total) by mouth every 6 (six) hours as needed., Disp: , Rfl:    aspirin  EC 81 MG tablet, Take 81 mg by mouth daily. Swallow whole., Disp: , Rfl:    Calcium  Carbonate (CALCIUM  500 PO), Take 1 tablet by mouth daily., Disp: , Rfl:    Cholecalciferol (VITAMIN D) 50 MCG (2000 UT) tablet, Take 2,000 Units by mouth daily., Disp: , Rfl:    levothyroxine  (SYNTHROID ) 150 MCG tablet, Take 150 mcg by mouth daily before breakfast., Disp: , Rfl:    MAGNESIUM PO, Take 1 tablet by mouth every evening., Disp: , Rfl:    Multiple Vitamin (MULTIVITAMIN) capsule, Take 1 capsule by mouth daily., Disp: , Rfl:    multivitamin-lutein (OCUVITE-LUTEIN) CAPS capsule, Take 1 capsule by mouth daily., Disp: , Rfl:    Omega-3 Fatty Acids (FISH OIL) 1000 MG CAPS, Take 1,000 mg by mouth daily., Disp: , Rfl:    rosuvastatin  (CRESTOR ) 40 MG tablet, Take 40 mg by mouth every evening., Disp: , Rfl:    sacubitril -valsartan  (ENTRESTO ) 49-51 MG, Take 1 tablet by mouth 2 (two) times daily., Disp: 180 tablet, Rfl: 3   spironolactone  (ALDACTONE ) 25 MG tablet, Take 0.5 tablets (12.5 mg total) by mouth daily., Disp: 15 tablet, Rfl: 11   tadalafil (CIALIS) 5 MG tablet, Take 5 mg by mouth daily as needed for erectile dysfunction., Disp: , Rfl:    tamsulosin  (FLOMAX ) 0.4 MG CAPS  capsule, Take 0.4 mg by mouth daily after breakfast., Disp: , Rfl:   Consent:   NA  Disposition:   68-month follow-up visit  His questions and concerns were addressed to his satisfaction. He voices understanding of the recommendations provided during this encounter.   Medical Decision Making:  Discussed management of at least two chronic comorbid conditions.  I have independently interpreted results of the EKG dated 04/24/2024, labs dated November 02, 2023 via [facility] database, reviewed the results of the coronary calcium  score, echocardiogram, stress test, heart catheterization as part of medical decision making.   Patient education provided at today's visit.   Prescription drug management including but not limited to: addition/titration/discontinuation of medical therapy, medication reconciliation, discussed potential side effects, follow up labs either reviewed or ordered for monitoring safety.   I have independently formulated and discussed the assessment and plan with the patient. Educated him on seeking medical attention sooner by either calling the office for sooner appointment or if unable to be seen in the office he is advised to go to the closest ER via EMS if he has new onset of chest pain or shortness of breath. Patient verbalized understanding.  I have reviewed prior progress notes authored by Dr. Aleene Passe as part of today's visit.   Signed, Madonna Large, DO, [facility]  A Division of [facility] [address]  [address] [place], [place] [address]  04/24/2024 12:46 PM

## 2024-04-24 NOTE — Patient Instructions (Signed)
 Medication Instructions:  No Changes *If you need a refill on your cardiac medications before your next appointment, please call your pharmacy*  Lab Work: None  Follow-Up: At [facility], you and your health needs are our priority.  As part of our continuing mission to provide you with exceptional heart care, our providers are all part of one team.  This team includes your primary Cardiologist (physician) and Advanced Practice Providers or APPs (Physician Assistants and Nurse Practitioners) who all work together to provide you with the care you need, when you need it.  Your next appointment:   6 month(s) (We will mail a reminder letter around Feb 2026; please call for a June 2026 appointment)  Provider:   Madonna Large, DO

## 2024-04-29 LAB — LAB REPORT - SCANNED: EGFR: 63

## 2024-05-01 ENCOUNTER — Telehealth: Payer: Self-pay

## 2024-05-01 NOTE — Telephone Encounter (Signed)
 Spoke with patient this morning in regards to lab appt prior to CT scan on 05/21/24.  Rescheduled patients lab appt on 05/21/24 to 3 PM.  He voiced thanks.

## 2024-05-21 ENCOUNTER — Other Ambulatory Visit

## 2024-05-21 ENCOUNTER — Ambulatory Visit (HOSPITAL_COMMUNITY)
Admission: RE | Admit: 2024-05-21 | Discharge: 2024-05-21 | Disposition: A | Discharge status: Home / Self Care | Source: Ambulatory Visit | Attending: Internal Medicine | Admitting: Internal Medicine

## 2024-05-21 ENCOUNTER — Inpatient Hospital Stay: Attending: Internal Medicine

## 2024-05-21 DIAGNOSIS — M8580 Other specified disorders of bone density and structure, unspecified site: Secondary | ICD-10-CM | POA: Diagnosis not present

## 2024-05-21 DIAGNOSIS — C3490 Malignant neoplasm of unspecified part of unspecified bronchus or lung: Secondary | ICD-10-CM | POA: Insufficient documentation

## 2024-05-21 DIAGNOSIS — Z79899 Other long term (current) drug therapy: Secondary | ICD-10-CM | POA: Diagnosis not present

## 2024-05-21 DIAGNOSIS — I252 Old myocardial infarction: Secondary | ICD-10-CM | POA: Diagnosis not present

## 2024-05-21 DIAGNOSIS — I700 Atherosclerosis of aorta: Secondary | ICD-10-CM | POA: Insufficient documentation

## 2024-05-21 DIAGNOSIS — I5022 Chronic systolic (congestive) heart failure: Secondary | ICD-10-CM | POA: Insufficient documentation

## 2024-05-21 DIAGNOSIS — R059 Cough, unspecified: Secondary | ICD-10-CM | POA: Insufficient documentation

## 2024-05-21 DIAGNOSIS — E890 Postprocedural hypothyroidism: Secondary | ICD-10-CM | POA: Insufficient documentation

## 2024-05-21 DIAGNOSIS — Z87442 Personal history of urinary calculi: Secondary | ICD-10-CM | POA: Insufficient documentation

## 2024-05-21 DIAGNOSIS — I130 Hypertensive heart and chronic kidney disease with heart failure and stage 1 through stage 4 chronic kidney disease, or unspecified chronic kidney disease: Secondary | ICD-10-CM | POA: Insufficient documentation

## 2024-05-21 DIAGNOSIS — M4854XA Collapsed vertebra, not elsewhere classified, thoracic region, initial encounter for fracture: Secondary | ICD-10-CM | POA: Insufficient documentation

## 2024-05-21 DIAGNOSIS — I2510 Atherosclerotic heart disease of native coronary artery without angina pectoris: Secondary | ICD-10-CM | POA: Insufficient documentation

## 2024-05-21 DIAGNOSIS — N400 Enlarged prostate without lower urinary tract symptoms: Secondary | ICD-10-CM | POA: Insufficient documentation

## 2024-05-21 DIAGNOSIS — Z79890 Hormone replacement therapy (postmenopausal): Secondary | ICD-10-CM | POA: Diagnosis not present

## 2024-05-21 DIAGNOSIS — M51379 Other intervertebral disc degeneration, lumbosacral region without mention of lumbar back pain or lower extremity pain: Secondary | ICD-10-CM | POA: Diagnosis not present

## 2024-05-21 DIAGNOSIS — K7689 Other specified diseases of liver: Secondary | ICD-10-CM | POA: Insufficient documentation

## 2024-05-21 DIAGNOSIS — E785 Hyperlipidemia, unspecified: Secondary | ICD-10-CM | POA: Diagnosis not present

## 2024-05-21 DIAGNOSIS — C3431 Malignant neoplasm of lower lobe, right bronchus or lung: Secondary | ICD-10-CM | POA: Diagnosis present

## 2024-05-21 DIAGNOSIS — I358 Other nonrheumatic aortic valve disorders: Secondary | ICD-10-CM | POA: Diagnosis not present

## 2024-05-21 DIAGNOSIS — N529 Male erectile dysfunction, unspecified: Secondary | ICD-10-CM | POA: Diagnosis not present

## 2024-05-21 LAB — CMP (CANCER CENTER ONLY)
ALT: 21 U/L (ref 0–44)
AST: 36 U/L (ref 15–41)
Albumin: 4.1 g/dL (ref 3.5–5.0)
Alkaline Phosphatase: 68 U/L (ref 38–126)
Anion gap: 9 (ref 5–15)
BUN: 16 mg/dL (ref 8–23)
CO2: 26 mmol/L (ref 22–32)
Calcium: 9.2 mg/dL (ref 8.9–10.3)
Chloride: 104 mmol/L (ref 98–111)
Creatinine: 1.34 mg/dL — ABNORMAL HIGH (ref 0.61–1.24)
GFR, Estimated: 56 mL/min — ABNORMAL LOW
Glucose, Bld: 115 mg/dL — ABNORMAL HIGH (ref 70–99)
Potassium: 3.9 mmol/L (ref 3.5–5.1)
Sodium: 138 mmol/L (ref 135–145)
Total Bilirubin: 0.4 mg/dL (ref 0.0–1.2)
Total Protein: 7.5 g/dL (ref 6.5–8.1)

## 2024-05-21 LAB — CBC WITH DIFFERENTIAL (CANCER CENTER ONLY)
Abs Immature Granulocytes: 0.01 K/uL (ref 0.00–0.07)
Basophils Absolute: 0.1 K/uL (ref 0.0–0.1)
Basophils Relative: 1 %
Eosinophils Absolute: 0.1 K/uL (ref 0.0–0.5)
Eosinophils Relative: 2 %
HCT: 39.9 % (ref 39.0–52.0)
Hemoglobin: 13.3 g/dL (ref 13.0–17.0)
Immature Granulocytes: 0 %
Lymphocytes Relative: 23 %
Lymphs Abs: 2.1 K/uL (ref 0.7–4.0)
MCH: 27.6 pg (ref 26.0–34.0)
MCHC: 33.3 g/dL (ref 30.0–36.0)
MCV: 82.8 fL (ref 80.0–100.0)
Monocytes Absolute: 0.4 K/uL (ref 0.1–1.0)
Monocytes Relative: 4 %
Neutro Abs: 6.7 K/uL (ref 1.7–7.7)
Neutrophils Relative %: 70 %
Platelet Count: 206 K/uL (ref 150–400)
RBC: 4.82 MIL/uL (ref 4.22–5.81)
RDW: 14.1 % (ref 11.5–15.5)
WBC Count: 9.5 K/uL (ref 4.0–10.5)
nRBC: 0 % (ref 0.0–0.2)

## 2024-05-21 MED ORDER — IOHEXOL 300 MG/ML  SOLN
75.0000 mL | Freq: Once | INTRAMUSCULAR | Status: AC | PRN
  Administered 2024-05-21: 75 mL via INTRAVENOUS

## 2024-05-28 ENCOUNTER — Inpatient Hospital Stay: Admitting: Internal Medicine

## 2024-05-28 VITALS — BP 159/90 | HR 95 | Temp 98.2°F | Resp 17 | Ht 72.0 in | Wt 191.8 lb

## 2024-05-28 DIAGNOSIS — C3490 Malignant neoplasm of unspecified part of unspecified bronchus or lung: Secondary | ICD-10-CM

## 2024-05-28 DIAGNOSIS — C3431 Malignant neoplasm of lower lobe, right bronchus or lung: Secondary | ICD-10-CM | POA: Diagnosis not present

## 2024-05-28 NOTE — Progress Notes (Signed)
 "      [facility] Telephone:([phone]) [phone]   Fax:([phone]) [phone]  OFFICE PROGRESS NOTE  Loreli Kins, MD [address] [place] [address]  DIAGNOSIS: Stage Ib (T2a, N0, M0) non-small cell lung cancer, adenocarcinoma involving the right lower lobe diagnosed in May 2025  PRIOR THERAPY: Status post right lower lobectomy with mediastinal lymph node sampling under the care of Dr. Shyrl on 10/16/2023.  The final tumor size was 2.1 cm with evidence for visceropleural invasion.  CURRENT THERAPY: Observation.  INTERVAL HISTORY: Omar Hill 74 y.o. male returns to the clinic today for follow-up visit. Discussed the use of AI scribe software for clinical note transcription with the patient, who gave verbal consent to proceed.  History of Present Illness Omar Hill is a 74 year old male with stage 1B non-small cell lung cancer, status post right lower lobectomy, who presents for routine surveillance and restaging.  He was diagnosed with stage 1B non-small cell lung cancer, adenocarcinoma, in May 2025 and underwent right lower lobectomy with mediastinal lymph node sampling in June 2025. He has been managed with observation and has not received adjuvant therapy.  He denies new or worsening symptoms since his last visit six months ago. He has no chest pain, dyspnea, hemoptysis, or unintentional weight loss. He describes occasional cough associated with aspiration of food or liquid, but denies persistent or productive cough.  He reports a recent adjustment in his levothyroxine  dosage by his primary care provider due to prior overmedication. He also notes elevated blood pressure at today's visit, which he attributes to rushing, but states that home blood pressure readings are typically within normal range.     MEDICAL HISTORY: Past Medical History:  Diagnosis Date   Aortic atherosclerosis    BPH (benign prostatic hyperplasia)    Cancer (HCC) 2025    Right Lung Cancer   Cervical spinal stenosis    Cervicalgia    CHF (congestive heart failure) (HCC)    CKD (chronic kidney disease), stage III (HCC)    Coronary artery disease    DDD (degenerative disc disease), lumbosacral    ED (erectile dysfunction)    a.) on PDE5i (tadalafil)   Graves disease    a.) s/p I-131 ablation   Hepatic cyst (multiple)    HFrEF (heart failure with reduced ejection fraction) (HCC)    HTN (hypertension)    Hyperlipidemia    LBBB (left bundle branch block)    Long-term use of aspirin  therapy    Myocardial infarction [facility])    based on stress test results.   Nephrolithiasis    Osteopenia    Postoperative hypothyroidism    Prostatitis 07/2023   Right lower lobe pulmonary nodule 05/11/2023   a.) CT chest 05/11/2023: spiculated 1.4 cm RLL mass; b.) high resolution CT chest 08/09/2023: partially solid spiculated nodule anterior RLL measuring 1.7 x 2.5 cm with an internal 1.3 x 1.6 cm solid component; adjacent satellite ground-glass nodule along the posterior margin measuring 5 mm; c.) PET CT 09/05/2023: hypermetabolic RLL spiculated nodule with max SUV 3.7    ALLERGIES:  is allergic to pollen extract, benadryl [diphenhydramine], and mucinex dm [dm-guaifenesin er].  MEDICATIONS:  Current Outpatient Medications  Medication Sig Dispense Refill   acetaminophen  (TYLENOL ) 325 MG tablet Take 2 tablets (650 mg total) by mouth every 6 (six) hours as needed.     aspirin  EC 81 MG tablet Take 81 mg by mouth daily. Swallow whole.     Calcium  Carbonate (CALCIUM   500 PO) Take 1 tablet by mouth daily.     Cholecalciferol (VITAMIN D) 50 MCG (2000 UT) tablet Take 2,000 Units by mouth daily.     levothyroxine  (SYNTHROID ) 150 MCG tablet Take 150 mcg by mouth daily before breakfast.     MAGNESIUM PO Take 1 tablet by mouth every evening.     Multiple Vitamin (MULTIVITAMIN) capsule Take 1 capsule by mouth daily.     multivitamin-lutein (OCUVITE-LUTEIN) CAPS capsule Take 1 capsule  by mouth daily.     Omega-3 Fatty Acids (FISH OIL) 1000 MG CAPS Take 1,000 mg by mouth daily.     rosuvastatin  (CRESTOR ) 40 MG tablet Take 40 mg by mouth every evening.     sacubitril -valsartan  (ENTRESTO ) 49-51 MG Take 1 tablet by mouth 2 (two) times daily. 180 tablet 3   spironolactone  (ALDACTONE ) 25 MG tablet Take 0.5 tablets (12.5 mg total) by mouth daily. 15 tablet 11   tadalafil (CIALIS) 5 MG tablet Take 5 mg by mouth daily as needed for erectile dysfunction.     tamsulosin  (FLOMAX ) 0.4 MG CAPS capsule Take 0.4 mg by mouth daily after breakfast.     No current facility-administered medications for this visit.    SURGICAL HISTORY:  Past Surgical History:  Procedure Laterality Date   ACHILLES TENDON REPAIR Left 1993   BRONCHOSCOPY, WITH BIOPSY USING ELECTROMAGNETIC NAVIGATION Bilateral 09/12/2023   Procedure: ROBOTIC ASSISTED NAVIGATIONAL BRONCHOSCOPY;  Surgeon: Isadora Hose, MD;  Location: [facility] ORS;  Service: Pulmonary;  Laterality: Bilateral;   COLONOSCOPY     HAND TENDON SURGERY Left    thumb   INTERCOSTAL NERVE BLOCK Right 10/16/2023   Procedure: BLOCK, NERVE, INTERCOSTAL;  Surgeon: Shyrl Linnie KIDD, MD;  Location: [facility] OR;  Service: Thoracic;  Laterality: Right;   LEFT HEART CATH AND CORONARY ANGIOGRAPHY N/A 07/31/2023   Procedure: LEFT HEART CATH AND CORONARY ANGIOGRAPHY;  Surgeon: Ladona Heinz, MD;  Location: [facility];  Service: Cardiovascular;  Laterality: N/A;   LOBECTOMY, LUNG, ROBOT-ASSISTED, USING VATS Right 10/16/2023   Procedure: LOBECTOMY, LUNG, ROBOT-ASSISTED, USING VATS;  Surgeon: Shyrl Linnie KIDD, MD;  Location: [facility] OR;  Service: Thoracic;  Laterality: Right;  RIGHT ROBOTIC RIGHT LOWER LOBECTOMY   LYMPH NODE BIOPSY Right 10/16/2023   Procedure: LYMPH NODE BIOPSY;  Surgeon: Shyrl Linnie KIDD, MD;  Location: [facility] OR;  Service: Thoracic;  Laterality: Right;   TONSILLECTOMY     As a child    REVIEW OF SYSTEMS:  A comprehensive review of systems was negative.    PHYSICAL EXAMINATION: General appearance: alert, cooperative, and no distress Head: Normocephalic, without obvious abnormality, atraumatic Neck: no adenopathy, no JVD, supple, symmetrical, trachea midline, and thyroid  not enlarged, symmetric, no tenderness/mass/nodules Lymph nodes: Cervical, supraclavicular, and axillary nodes normal. Resp: clear to auscultation bilaterally Back: symmetric, no curvature. ROM normal. No CVA tenderness. Cardio: regular rate and rhythm, S1, S2 normal, no murmur, click, rub or gallop GI: soft, non-tender; bowel sounds normal; no masses,  no organomegaly Extremities: extremities normal, atraumatic, no cyanosis or edema  ECOG PERFORMANCE STATUS: 1 - Symptomatic but completely ambulatory  Blood pressure (!) 159/90, pulse 95, temperature 98.2 F (36.8 C), temperature source Temporal, resp. rate 17, height 6' (1.829 m), weight 191 lb 12.8 oz (87 kg), SpO2 99%.  LABORATORY DATA: Lab Results  Component Value Date   WBC 9.5 05/21/2024   HGB 13.3 05/21/2024   HCT 39.9 05/21/2024   MCV 82.8 05/21/2024   PLT 206 05/21/2024      Chemistry  Component Value Date/Time   NA 138 05/21/2024 1438   NA 142 10/06/2023 0931   K 3.9 05/21/2024 1438   CL 104 05/21/2024 1438   CO2 26 05/21/2024 1438   BUN 16 05/21/2024 1438   BUN 14 10/06/2023 0931   CREATININE 1.34 (H) 05/21/2024 1438      Component Value Date/Time   CALCIUM  9.2 05/21/2024 1438   ALKPHOS 68 05/21/2024 1438   AST 36 05/21/2024 1438   ALT 21 05/21/2024 1438   BILITOT 0.4 05/21/2024 1438       RADIOGRAPHIC STUDIES: CT Chest W Contrast Result Date: 05/27/2024 CLINICAL DATA:  Non-small cell lung cancer (NSCLC), staging Previous right lung surgery.  * Tracking Code: BO * EXAM: CT CHEST WITH CONTRAST TECHNIQUE: Multidetector CT imaging of the chest was performed during intravenous contrast administration. RADIATION DOSE REDUCTION: This exam was performed according to the departmental  dose-optimization program which includes automated exposure control, adjustment of the mA and/or kV according to patient size and/or use of iterative reconstruction technique. CONTRAST:  75mL OMNIPAQUE  IOHEXOL  300 MG/ML  SOLN COMPARISON:  Chest CT and PET-CT 09/05/2023. FINDINGS: Cardiovascular: No acute vascular findings are demonstrated. Moderate coronary artery atherosclerosis with lesser involvement of the aorta and great vessels. Mild calcification of the aortic valve. The heart size is normal. There is no pericardial effusion. Mediastinum/Nodes: There are no enlarged mediastinal, hilar or axillary lymph nodes. The thyroid  gland, trachea and esophagus demonstrate no significant findings. Lungs/Pleura: Interval right lower lobectomy. There is no pleural effusion or pneumothorax. Bandlike scarring along a suture line posteromedially in the right upper lobe. No suspicious pulmonary nodularity. Upper abdomen: No significant findings within the visualized upper abdomen. Grossly stable cystic lesions within the liver. Musculoskeletal/Chest wall: There is no chest wall mass or suspicious osseous finding. Stable mild spondylosis and mild chronic superior endplate compression deformity at T3. IMPRESSION: 1. Interval right lower lobectomy. 2. No evidence of local recurrence or metastatic disease. 3. No acute chest findings. 4.  Aortic Atherosclerosis (ICD10-I70.0). Electronically Signed   By: Elsie Perone M.D.   On: 05/27/2024 08:59    ASSESSMENT AND PLAN: This is a very pleasant 74 years old African-American male with Stage Ib (t2a, N0, M0) non-small cell lung cancer, adenocarcinoma involving the right lower lobe diagnosed in May 2025 He is status post right lower lobectomy with mediastinal lymph node sampling under the care of Dr. Shyrl on 10/16/2023.  The final tumor size was 2.1 cm with evidence for visceropleural invasion. The patient is currently on observation and he is feeling fine. He had repeat CT  scan of the chest performed recently.  I personally independently reviewed the scan and discussed the result with the patient today.  His scan showed no concerning findings for disease recurrence or metastasis. Assessment and Plan Assessment & Plan Stage 1B non-small cell lung cancer, adenocarcinoma Diagnosed in May 2025, he underwent a right lower lobectomy with mediastinal lymph node sampling in June 2025. A recent CT scan shows no evidence of disease recurrence. He is asymptomatic, with occasional coughing when food enters the windpipe. - Continue observation with repeat CT scans every 6 months for 2 years, then transition to annual scans.  Hypertension Blood pressure was elevated during today's visit, likely due to rushing, but home readings are within normal range (119-124/70-78 mmHg).  Hypothyroidism Synthroid  dosage was recently adjusted by his primary care physician due to overmedication to optimize thyroid  hormone levels.  He was advised to call immediately if he has any  concerning symptoms in the interval. The patient voices understanding of current disease status and treatment options and is in agreement with the current care plan.  All questions were answered. The patient knows to call the clinic with any problems, questions or concerns. We can certainly see the patient much sooner if necessary.  The total time spent in the appointment was 20 minutes including review of chart and various tests results, discussions about plan of care and coordination of care plan .  Disclaimer: This note was dictated with voice recognition software. Similar sounding words can inadvertently be transcribed and may not be corrected upon review.        "

## 2024-11-18 ENCOUNTER — Inpatient Hospital Stay

## 2024-11-25 ENCOUNTER — Inpatient Hospital Stay: Admitting: Internal Medicine
